# Patient Record
Sex: Female | Born: 1960
Health system: Southern US, Community
[De-identification: ages and names within clinical notes are randomized; demographics above are authoritative.]

## PROBLEM LIST (undated history)

## (undated) HISTORY — PX: POLYPECTOMY: SHX149

## (undated) HISTORY — PX: COLONOSCOPY: SHX174

---

## 2010-08-04 LAB — CONVERTED CEMR LAB: Pap Smear: NORMAL

## 2010-09-28 ENCOUNTER — Ambulatory Visit
Admission: RE | Admit: 2010-09-28 | Discharge: 2010-09-28 | Payer: Self-pay | Source: Home / Self Care | Attending: Family Medicine | Admitting: Family Medicine

## 2010-09-28 DIAGNOSIS — D239 Other benign neoplasm of skin, unspecified: Secondary | ICD-10-CM | POA: Insufficient documentation

## 2010-10-06 NOTE — Assessment & Plan Note (Signed)
Summary: NEW TO EST//CCM   Vital Signs:  Patient profile:   50 year old female Menstrual status:  regular LMP:     09/19/2010 Height:      63.25 inches Weight:      136 pounds BMI:     23.99 Temp:     98.1 degrees F oral Pulse rate:   72 / minute Pulse rhythm:   regular Resp:     12 per minute BP sitting:   102 / 72  (left arm) Cuff size:   regular  Vitals Entered By: Sid Falcon LPN (September 28, 2010 10:28 AM)  History of Present Illness: New patient to establish care. No major medical problems. History of preeclampsia during her only pregnancy. C-Section 2005.  History of dysplastic nevi.  Is already scheduled to see dermatologist here. Multiple nevi removed in past. No medications. No known allergies.  Both grandmothers with history of colon cancer. Aunt with type 1 diabetes.  Patient is married. Homemaker. Nonsmoker. Occasional alcohol use.  Patient is scheduled to see gynecologist yearly with recent normal Pap smear last December. Tetanus 2009 or 2010.  Preventive Screening-Counseling & Management  Alcohol-Tobacco     Smoking Status: never  Caffeine-Diet-Exercise     Does Patient Exercise: yes  Allergies (verified): No Known Drug Allergies  Past History:  Family History: Last updated: 09/28/2010 Maternal grandmother, colon ca Paternal grandmother, colon ca Mothers sister, diabetes type 1 Paternal grandfather, heart attack age 61  Social History: Last updated: 09/28/2010 Occupation: Married Never Smoked Alcohol use-yes Regular exercise-yes 1 pregnancy 1 live birth  Risk Factors: Exercise: yes (09/28/2010)  Risk Factors: Smoking Status: never (09/28/2010)  Past Medical History: UTI HBP following C-Section  Past Surgical History: Caesarean section PMH-FH-SH reviewed for relevance  Family History: Maternal grandmother, colon ca Paternal grandmother, colon ca Mothers sister, diabetes type 1 Paternal grandfather, heart attack age  49  Social History: Occupation: Married Never Smoked Alcohol use-yes Regular exercise-yes 1 pregnancy 1 live birth Occupation:  employed Smoking Status:  never Does Patient Exercise:  yes  Review of Systems  The patient denies anorexia, fever, weight loss, weight gain, vision loss, decreased hearing, hoarseness, chest pain, syncope, dyspnea on exertion, peripheral edema, prolonged cough, headaches, hemoptysis, abdominal pain, melena, hematochezia, severe indigestion/heartburn, hematuria, incontinence, genital sores, muscle weakness, suspicious skin lesions, transient blindness, difficulty walking, depression, unusual weight change, abnormal bleeding, enlarged lymph nodes, and breast masses.    Physical Exam  General:  Well-developed,well-nourished,in no acute distress; alert,appropriate and cooperative throughout examination Ears:  External ear exam shows no significant lesions or deformities.  Otoscopic examination reveals clear canals, tympanic membranes are intact bilaterally without bulging, retraction, inflammation or discharge. Hearing is grossly normal bilaterally. Mouth:  Oral mucosa and oropharynx without lesions or exudates.  Teeth in good repair. Neck:  No deformities, masses, or tenderness noted. Lungs:  Normal respiratory effort, chest expands symmetrically. Lungs are clear to auscultation, no crackles or wheezes. Heart:  Normal rate and regular rhythm. S1 and S2 normal without gallop, murmur, click, rub or other extra sounds. Extremities:  No clubbing, cyanosis, edema, or deformity noted with normal full range of motion of all joints.   Neurologic:  alert & oriented X3 and cranial nerves II-XII intact.     Impression & Recommendations:  Problem # 1:  DYSPLASTIC NEVUS (ICD-216.9) history of.  She will cont f/u with derm yearly.  Patient Instructions: 1)  Consider CPE at some point later this year.   Orders Added: 1)  New  Patient Level II [99202]    Preventive  Care Screening  Mammogram:    Date:  08/04/2010    Results:  normal   Pap Smear:    Date:  08/04/2010    Results:  normal   Last Tetanus Booster:    Date:  03/04/2008    Results:  Historical

## 2011-10-19 LAB — HM MAMMOGRAPHY: HM Mammogram: NEGATIVE

## 2011-10-19 LAB — HM PAP SMEAR: HM Pap smear: NEGATIVE

## 2011-11-01 ENCOUNTER — Other Ambulatory Visit: Payer: Self-pay | Admitting: Obstetrics and Gynecology

## 2011-11-01 DIAGNOSIS — R928 Other abnormal and inconclusive findings on diagnostic imaging of breast: Secondary | ICD-10-CM

## 2011-11-10 ENCOUNTER — Other Ambulatory Visit: Payer: Self-pay

## 2011-11-17 ENCOUNTER — Ambulatory Visit
Admission: RE | Admit: 2011-11-17 | Discharge: 2011-11-17 | Disposition: A | Discharge status: Home / Self Care | Payer: 59 | Source: Ambulatory Visit | Attending: Obstetrics and Gynecology | Admitting: Obstetrics and Gynecology

## 2011-11-17 DIAGNOSIS — R928 Other abnormal and inconclusive findings on diagnostic imaging of breast: Secondary | ICD-10-CM

## 2012-08-06 ENCOUNTER — Other Ambulatory Visit: Payer: 59

## 2012-08-13 ENCOUNTER — Encounter: Payer: 59 | Admitting: Family Medicine

## 2012-09-10 ENCOUNTER — Other Ambulatory Visit (INDEPENDENT_AMBULATORY_CARE_PROVIDER_SITE_OTHER): Payer: 59

## 2012-09-10 DIAGNOSIS — Z0000 Encounter for general adult medical examination without abnormal findings: Secondary | ICD-10-CM

## 2012-09-10 LAB — POCT URINALYSIS DIPSTICK
Glucose, UA: NEGATIVE
Ketones, UA: NEGATIVE
Leukocytes, UA: NEGATIVE
Nitrite, UA: NEGATIVE
Spec Grav, UA: >=1.03
Urobilinogen, UA: 0.2
pH, UA: 5.5

## 2012-09-10 LAB — HEPATIC FUNCTION PANEL
ALT: 16 U/L (ref 0–35)
AST: 15 U/L (ref 0–37)
Albumin: 4.3 g/dL (ref 3.5–5.2)
Alkaline Phosphatase: 50 U/L (ref 39–117)
Bilirubin, Direct: 0.1 mg/dL (ref 0.0–0.3)
Total Bilirubin: 1 mg/dL (ref 0.3–1.2)
Total Protein: 7.1 g/dL (ref 6.0–8.3)

## 2012-09-10 LAB — CBC WITH DIFFERENTIAL/PLATELET
Basophils Absolute: 0 10*3/uL (ref 0.0–0.1)
Basophils Relative: 0.5 % (ref 0.0–3.0)
Eosinophils Absolute: 0.1 10*3/uL (ref 0.0–0.7)
Eosinophils Relative: 2.3 % (ref 0.0–5.0)
HCT: 39.4 % (ref 36.0–46.0)
Hemoglobin: 13.2 g/dL (ref 12.0–15.0)
Lymphocytes Relative: 19.6 % (ref 12.0–46.0)
Lymphs Abs: 1 10*3/uL (ref 0.7–4.0)
MCHC: 33.5 g/dL (ref 30.0–36.0)
MCV: 91.1 fl (ref 78.0–100.0)
Monocytes Absolute: 0.6 10*3/uL (ref 0.1–1.0)
Monocytes Relative: 11.6 % (ref 3.0–12.0)
Neutro Abs: 3.5 10*3/uL (ref 1.4–7.7)
Neutrophils Relative %: 66 % (ref 43.0–77.0)
Platelets: 217 10*3/uL (ref 150.0–400.0)
RBC: 4.33 Mil/uL (ref 3.87–5.11)
RDW: 12.1 % (ref 11.5–14.6)
WBC: 5.3 10*3/uL (ref 4.5–10.5)

## 2012-09-10 LAB — BASIC METABOLIC PANEL
BUN: 12 mg/dL (ref 6–23)
CO2: 30 mEq/L (ref 19–32)
Calcium: 9.3 mg/dL (ref 8.4–10.5)
Chloride: 104 mEq/L (ref 96–112)
Creatinine, Ser: 0.7 mg/dL (ref 0.4–1.2)
GFR: 98.96 mL/min (ref >60.00)
Glucose, Bld: 85 mg/dL (ref 70–99)
Potassium: 3.5 mEq/L (ref 3.5–5.1)
Sodium: 138 mEq/L (ref 135–145)

## 2012-09-10 LAB — LIPID PANEL
Cholesterol: 172 mg/dL (ref 0–200)
HDL: 52.6 mg/dL (ref >39.00)
LDL Cholesterol: 100 mg/dL — ABNORMAL HIGH (ref 0–99)
Total CHOL/HDL Ratio: 3
Triglycerides: 97 mg/dL (ref 0.0–149.0)
VLDL: 19.4 mg/dL (ref 0.0–40.0)

## 2012-09-10 LAB — TSH: TSH: 1.07 u[IU]/mL (ref 0.35–5.50)

## 2012-09-17 ENCOUNTER — Encounter: Payer: Self-pay | Admitting: Family Medicine

## 2012-09-17 ENCOUNTER — Ambulatory Visit (INDEPENDENT_AMBULATORY_CARE_PROVIDER_SITE_OTHER): Payer: 59 | Admitting: Family Medicine

## 2012-09-17 VITALS — BP 110/78 | HR 72 | Temp 98.3°F | Resp 12 | Ht 64.0 in | Wt 135.0 lb

## 2012-09-17 DIAGNOSIS — Z0000 Encounter for general adult medical examination without abnormal findings: Secondary | ICD-10-CM

## 2012-09-17 LAB — URINALYSIS, ROUTINE W REFLEX MICROSCOPIC
Bilirubin Urine: NEGATIVE
Ketones, ur: NEGATIVE
Leukocytes, UA: NEGATIVE
Nitrite: NEGATIVE
Specific Gravity, Urine: >=1.03 (ref 1.000–1.030)
Total Protein, Urine: NEGATIVE
Urine Glucose: NEGATIVE
Urobilinogen, UA: 0.2 (ref 0.0–1.0)
pH: 6 (ref 5.0–8.0)

## 2012-09-17 LAB — POCT URINALYSIS DIPSTICK
Bilirubin, UA: NEGATIVE
Glucose, UA: NEGATIVE
Ketones, UA: NEGATIVE
Leukocytes, UA: NEGATIVE
Nitrite, UA: NEGATIVE
Protein, UA: NEGATIVE
Spec Grav, UA: 1.025
Urobilinogen, UA: 0.2
pH, UA: 6

## 2012-09-17 NOTE — Progress Notes (Signed)
  Subjective:    Patient ID: Cheyenne Rojas, female    DOB: Nov 29, 1960, 52 y.o.   MRN: [id number]  HPI  Patient seen for complete physical. She sees gynecologist regularly. No significant chronic medical problems. Flu vaccine in the fall. Tetanus up-to-date. Turned 51 this year. No history of prior colonoscopy. She exercises with walking. Nonsmoker.  Family history reviewed in unrevealing other than she had maternal grandmother with colon cancer.  No past medical history on file. No past surgical history on file.  reports that she has never smoked. She does not have any smokeless tobacco history on file. Her alcohol and drug histories not on file. family history is not on file. No Known Allergies   Review of Systems  Constitutional: Negative for fever, activity change, appetite change, fatigue and unexpected weight change.  HENT: Negative for hearing loss, ear pain, sore throat and trouble swallowing.   Eyes: Negative for visual disturbance.  Respiratory: Negative for cough and shortness of breath.   Cardiovascular: Negative for chest pain and palpitations.  Gastrointestinal: Negative for abdominal pain, diarrhea, constipation and blood in stool.  Genitourinary: Negative for dysuria and hematuria.  Musculoskeletal: Negative for myalgias, back pain and arthralgias.  Skin: Negative for rash.  Neurological: Negative for dizziness, syncope and headaches.  Hematological: Negative for adenopathy.  Psychiatric/Behavioral: Negative for confusion and dysphoric mood.       Objective:   Physical Exam  Constitutional: She is oriented to person, place, and time. She appears well-developed and well-nourished.  HENT:  Head: Normocephalic and atraumatic.  Eyes: EOM are normal. Pupils are equal, round, and reactive to light.  Neck: Normal range of motion. Neck supple. No thyromegaly present.  Cardiovascular: Normal rate, regular rhythm and normal heart sounds.   No murmur  heard. Pulmonary/Chest: Breath sounds normal. No respiratory distress. She has no wheezes. She has no rales.  Abdominal: Soft. Bowel sounds are normal. She exhibits no distension and no mass. There is no tenderness. There is no rebound and no guarding.  Genitourinary:       Per GYN  Musculoskeletal: Normal range of motion. She exhibits no edema.  Lymphadenopathy:    She has no cervical adenopathy.  Neurological: She is alert and oriented to person, place, and time. No cranial nerve deficit.  Skin: No rash noted.  Psychiatric: She has a normal mood and affect. Her behavior is normal. Judgment and thought content normal.          Assessment & Plan:  Healthy 52 year old female. She has blood on urine dipstick again with repeat today. Send for urine micro-. Set up screening colonoscopy

## 2012-09-17 NOTE — Patient Instructions (Addendum)
We will call you regarding colonoscopy screening We are sending urine micro and if positive for blood we will be back in touch

## 2012-09-19 ENCOUNTER — Other Ambulatory Visit: Payer: Self-pay | Admitting: *Deleted

## 2012-09-19 DIAGNOSIS — R319 Hematuria, unspecified: Secondary | ICD-10-CM

## 2012-09-19 NOTE — Progress Notes (Signed)
Quick Note:  Pt informed, future UA ordered ______ 

## 2012-09-24 ENCOUNTER — Encounter: Payer: Self-pay | Admitting: Internal Medicine

## 2012-10-01 ENCOUNTER — Other Ambulatory Visit (INDEPENDENT_AMBULATORY_CARE_PROVIDER_SITE_OTHER): Payer: 59

## 2012-10-01 ENCOUNTER — Other Ambulatory Visit: Payer: 59 | Admitting: Family Medicine

## 2012-10-01 DIAGNOSIS — R319 Hematuria, unspecified: Secondary | ICD-10-CM

## 2012-10-01 LAB — POCT URINALYSIS DIPSTICK
Bilirubin, UA: NEGATIVE
Glucose, UA: NEGATIVE
Ketones, UA: NEGATIVE
Leukocytes, UA: NEGATIVE
Nitrite, UA: NEGATIVE
Protein, UA: NEGATIVE
Spec Grav, UA: 1.025
Urobilinogen, UA: 0.2
pH, UA: 5.5

## 2012-10-02 ENCOUNTER — Other Ambulatory Visit: Payer: Self-pay | Admitting: Family Medicine

## 2012-10-02 DIAGNOSIS — R319 Hematuria, unspecified: Secondary | ICD-10-CM

## 2012-10-08 ENCOUNTER — Other Ambulatory Visit: Payer: 59

## 2012-10-08 DIAGNOSIS — R319 Hematuria, unspecified: Secondary | ICD-10-CM

## 2012-10-09 LAB — URINALYSIS, MICROSCOPIC ONLY
Bacteria, UA: NONE SEEN
Casts: NONE SEEN
Crystals: NONE SEEN
Squamous Epithelial / LPF: NONE SEEN

## 2012-10-16 NOTE — Progress Notes (Signed)
Quick Note:  Pt informed ______ 

## 2012-10-20 ENCOUNTER — Other Ambulatory Visit: Payer: Self-pay

## 2012-10-25 ENCOUNTER — Ambulatory Visit (AMBULATORY_SURGERY_CENTER): Payer: 59

## 2012-10-25 ENCOUNTER — Encounter: Payer: Self-pay | Admitting: Internal Medicine

## 2012-10-25 VITALS — Ht 64.0 in | Wt 140.0 lb

## 2012-10-25 DIAGNOSIS — Z1211 Encounter for screening for malignant neoplasm of colon: Secondary | ICD-10-CM

## 2012-10-25 MED ORDER — MOVIPREP 100 G PO SOLR: 1.0000 | Freq: Once | ORAL | Status: DC

## 2012-11-13 ENCOUNTER — Encounter: Payer: 59 | Admitting: Internal Medicine

## 2012-11-29 ENCOUNTER — Encounter: Payer: Self-pay | Admitting: Internal Medicine

## 2012-11-29 ENCOUNTER — Ambulatory Visit (AMBULATORY_SURGERY_CENTER): Payer: 59 | Admitting: Internal Medicine

## 2012-11-29 VITALS — BP 101/75 | HR 81 | Temp 98.1°F | Resp 18 | Ht 64.0 in | Wt 140.0 lb

## 2012-11-29 DIAGNOSIS — D126 Benign neoplasm of colon, unspecified: Secondary | ICD-10-CM

## 2012-11-29 DIAGNOSIS — Z1211 Encounter for screening for malignant neoplasm of colon: Secondary | ICD-10-CM

## 2012-11-29 MED ORDER — SODIUM CHLORIDE 0.9 % IV SOLN: 500.0000 mL | INTRAVENOUS | Status: DC

## 2012-11-29 NOTE — Progress Notes (Signed)
Report to pacu rn, vss, bbs=clear 

## 2012-11-29 NOTE — Progress Notes (Signed)
Patient did not experience any of the following events: a burn prior to discharge; a fall within the facility; wrong site/side/patient/procedure/implant event; or a hospital transfer or hospital admission upon discharge from the facility. (G8907) Patient did not have preoperative order for IV antibiotic SSI prophylaxis. (G8918)  

## 2012-11-29 NOTE — Op Note (Signed)
[facility] [address] [place], [address]   COLONOSCOPY PROCEDURE REPORT  PATIENT: Cheyenne, Rojas  MR#: [id number] BIRTHDATE: 1960/11/24 , 51  yrs. old GENDER: Female ENDOSCOPIST: Hart Carwin, MD REFERRED BY:  Evelena Peat, M.D. PROCEDURE DATE:  11/29/2012 PROCEDURE:   Colonoscopy with snare polypectomy ASA CLASS:   Class I INDICATIONS:Average risk patient for colon cancer and 2 grandmothers with colon cancer. MEDICATIONS: MAC sedation, administered by CRNA and Propofol (Diprivan) 270 mg IV  DESCRIPTION OF PROCEDURE:   After the risks and benefits and of the procedure were explained, informed consent was obtained.  A digital rectal exam revealed no abnormalities of the rectum.    The LB PCF-H180AL [id number]  endoscope was introduced through the anus and advanced to the cecum, which was identified by both the appendix and ileocecal valve .  The quality of the prep was excellent, using MoviPrep .  The instrument was then slowly withdrawn as the colon was fully examined.     COLON FINDINGS: A smooth sessile polyp ranging between 5-66mm in size was found in the descending colon.  A polypectomy was performed with a cold snare.  The resection was complete and the polyp tissue was completely retrieved.   Moderate sized internal and external hemorrhoids were found.     Retroflexed views revealed no abnormalities.     The scope was then withdrawn from the patient and the procedure completed.  COMPLICATIONS: There were no complications. ENDOSCOPIC IMPRESSION: 1.   Sessile polyp ranging between 5-40mm in size was found in the descending colon; polypectomy was performed with a cold snare 2.   Moderate sized internal and external hemorrhoids  RECOMMENDATIONS: 1.  Await pathology results 2.  High fiber diet   REPEAT EXAM: for Colonoscopy, pending biopsy results.  cc:  _______________________________ eSignedHart Carwin, MD 11/29/2012 10:12  AM     PATIENT NAME:  Cheyenne, Rojas MR#: [id number]

## 2012-11-29 NOTE — Progress Notes (Signed)
Called to room to assist during endoscopic procedure.  Patient ID and intended procedure confirmed with present staff. Received instructions for my participation in the procedure from the performing physician.  

## 2012-11-29 NOTE — Patient Instructions (Addendum)

## 2012-12-02 ENCOUNTER — Telehealth: Payer: Self-pay

## 2012-12-02 NOTE — Telephone Encounter (Signed)
Left a message on the pt's answering machine at [phone] to call if any questions or concerns. Maw

## 2012-12-03 ENCOUNTER — Encounter: Payer: Self-pay | Admitting: Internal Medicine

## 2013-01-22 ENCOUNTER — Other Ambulatory Visit: Payer: Self-pay | Admitting: Obstetrics and Gynecology

## 2013-03-10 ENCOUNTER — Ambulatory Visit: Payer: 59 | Admitting: Family Medicine

## 2013-03-13 ENCOUNTER — Ambulatory Visit (INDEPENDENT_AMBULATORY_CARE_PROVIDER_SITE_OTHER): Payer: 59 | Admitting: Family Medicine

## 2013-03-13 ENCOUNTER — Telehealth: Payer: Self-pay | Admitting: Family Medicine

## 2013-03-13 DIAGNOSIS — Z23 Encounter for immunization: Secondary | ICD-10-CM

## 2013-03-13 NOTE — Telephone Encounter (Signed)
PT requested to come at 10am for her upcoming injection appt on 03/20/13. Is this okay with you?

## 2013-03-13 NOTE — Telephone Encounter (Signed)
Yes that is fine, but i would prefer 8 or 815. If that is okay with the patient

## 2013-03-20 ENCOUNTER — Ambulatory Visit (INDEPENDENT_AMBULATORY_CARE_PROVIDER_SITE_OTHER): Payer: 59 | Admitting: Family Medicine

## 2013-03-20 DIAGNOSIS — Z23 Encounter for immunization: Secondary | ICD-10-CM

## 2013-04-04 ENCOUNTER — Ambulatory Visit (INDEPENDENT_AMBULATORY_CARE_PROVIDER_SITE_OTHER): Payer: 59 | Admitting: *Deleted

## 2013-04-04 DIAGNOSIS — Z23 Encounter for immunization: Secondary | ICD-10-CM

## 2013-05-20 ENCOUNTER — Ambulatory Visit (INDEPENDENT_AMBULATORY_CARE_PROVIDER_SITE_OTHER): Payer: 59

## 2013-05-20 DIAGNOSIS — Z23 Encounter for immunization: Secondary | ICD-10-CM

## 2013-11-11 ENCOUNTER — Other Ambulatory Visit (INDEPENDENT_AMBULATORY_CARE_PROVIDER_SITE_OTHER): Payer: BC Managed Care – PPO

## 2013-11-11 DIAGNOSIS — Z0000 Encounter for general adult medical examination without abnormal findings: Secondary | ICD-10-CM

## 2013-11-11 LAB — LIPID PANEL
Cholesterol: 175 mg/dL (ref 0–200)
HDL: 61 mg/dL (ref >39.00)
LDL Cholesterol: 104 mg/dL — ABNORMAL HIGH (ref 0–99)
Total CHOL/HDL Ratio: 3
Triglycerides: 48 mg/dL (ref 0.0–149.0)
VLDL: 9.6 mg/dL (ref 0.0–40.0)

## 2013-11-11 LAB — HEPATIC FUNCTION PANEL
ALT: 26 U/L (ref 0–35)
AST: 25 U/L (ref 0–37)
Albumin: 4.5 g/dL (ref 3.5–5.2)
Alkaline Phosphatase: 68 U/L (ref 39–117)
Bilirubin, Direct: 0.1 mg/dL (ref 0.0–0.3)
Total Bilirubin: 0.9 mg/dL (ref 0.3–1.2)
Total Protein: 7.2 g/dL (ref 6.0–8.3)

## 2013-11-11 LAB — POCT URINALYSIS DIPSTICK
Bilirubin, UA: NEGATIVE
Glucose, UA: NEGATIVE
Ketones, UA: NEGATIVE
Leukocytes, UA: NEGATIVE
Nitrite, UA: NEGATIVE
Spec Grav, UA: >=1.03
Urobilinogen, UA: 0.2
pH, UA: 5

## 2013-11-11 LAB — CBC WITH DIFFERENTIAL/PLATELET
Basophils Absolute: 0 10*3/uL (ref 0.0–0.1)
Basophils Relative: 0.3 % (ref 0.0–3.0)
Eosinophils Absolute: 0.1 10*3/uL (ref 0.0–0.7)
Eosinophils Relative: 1.8 % (ref 0.0–5.0)
HCT: 41.2 % (ref 36.0–46.0)
Hemoglobin: 13.9 g/dL (ref 12.0–15.0)
Lymphocytes Relative: 14.5 % (ref 12.0–46.0)
Lymphs Abs: 1 10*3/uL (ref 0.7–4.0)
MCHC: 33.7 g/dL (ref 30.0–36.0)
MCV: 91.4 fl (ref 78.0–100.0)
Monocytes Absolute: 0.7 10*3/uL (ref 0.1–1.0)
Monocytes Relative: 9.9 % (ref 3.0–12.0)
Neutro Abs: 4.9 10*3/uL (ref 1.4–7.7)
Neutrophils Relative %: 73.5 % (ref 43.0–77.0)
Platelets: 219 10*3/uL (ref 150.0–400.0)
RBC: 4.51 Mil/uL (ref 3.87–5.11)
RDW: 13.7 % (ref 11.5–14.6)
WBC: 6.7 10*3/uL (ref 4.5–10.5)

## 2013-11-11 LAB — BASIC METABOLIC PANEL
BUN: 14 mg/dL (ref 6–23)
CO2: 23 mEq/L (ref 19–32)
Calcium: 9 mg/dL (ref 8.4–10.5)
Chloride: 105 mEq/L (ref 96–112)
Creatinine, Ser: 0.6 mg/dL (ref 0.4–1.2)
GFR: 105.75 mL/min (ref >60.00)
Glucose, Bld: 70 mg/dL (ref 70–99)
Potassium: 3.7 mEq/L (ref 3.5–5.1)
Sodium: 136 mEq/L (ref 135–145)

## 2013-11-11 LAB — TSH: TSH: 0.38 u[IU]/mL (ref 0.35–5.50)

## 2013-11-12 ENCOUNTER — Encounter: Payer: 59 | Admitting: Family Medicine

## 2013-11-14 ENCOUNTER — Encounter: Payer: Self-pay | Admitting: Family Medicine

## 2013-11-14 ENCOUNTER — Ambulatory Visit (INDEPENDENT_AMBULATORY_CARE_PROVIDER_SITE_OTHER): Payer: BC Managed Care – PPO | Admitting: Family Medicine

## 2013-11-14 VITALS — BP 112/76 | HR 88 | Ht 64.0 in | Wt 146.0 lb

## 2013-11-14 DIAGNOSIS — Z0000 Encounter for general adult medical examination without abnormal findings: Secondary | ICD-10-CM

## 2013-11-14 DIAGNOSIS — R319 Hematuria, unspecified: Secondary | ICD-10-CM

## 2013-11-14 LAB — POCT URINALYSIS DIPSTICK
Bilirubin, UA: NEGATIVE
Glucose, UA: NEGATIVE
Ketones, UA: NEGATIVE
Leukocytes, UA: NEGATIVE
Nitrite, UA: NEGATIVE
Protein, UA: NEGATIVE
Spec Grav, UA: 1.025
Urobilinogen, UA: 0.2
pH, UA: 6

## 2013-11-14 NOTE — Progress Notes (Signed)
Pre visit review using our clinic review tool, if applicable. No additional management support is needed unless otherwise documented below in the visit note. 

## 2013-11-14 NOTE — Addendum Note (Signed)
Addended by: Marcina Millard on: 11/14/2013 10:46 AM   Modules accepted: Orders

## 2013-11-14 NOTE — Progress Notes (Signed)
   Subjective:    Patient ID: Cheyenne Rojas, female    DOB: March 17, 1961, 53 y.o.   MRN: [id number]  HPI Patient seen for complete physical Generally very healthy. She sees gynecologist regularly. Takes no medications. She's been exercising regularly. Nonsmoker. Family history of longevity on both sides. Patient colonoscopy last year. She is getting regular Pap smears. Tetanus up-to-date. She also sees dermatologist regularly  No past medical history on file. Past Surgical History  Procedure Laterality Date  . Cesarean section      reports that she has never smoked. She has never used smokeless tobacco. She reports that she drinks alcohol. She reports that she does not use illicit drugs. family history includes Colon cancer in her maternal grandmother and paternal grandmother. There is no history of Esophageal cancer, Rectal cancer, or Stomach cancer. No Known Allergies    Review of Systems  Constitutional: Negative for fever, activity change, appetite change, fatigue and unexpected weight change.  HENT: Negative for ear pain, hearing loss, sore throat and trouble swallowing.   Eyes: Negative for visual disturbance.  Respiratory: Negative for cough and shortness of breath.   Cardiovascular: Negative for chest pain and palpitations.  Gastrointestinal: Negative for abdominal pain, diarrhea, constipation and blood in stool.  Endocrine: Negative for polydipsia and polyuria.  Genitourinary: Negative for dysuria and hematuria.  Musculoskeletal: Negative for arthralgias, back pain and myalgias.  Skin: Negative for rash.  Neurological: Negative for dizziness, syncope and headaches.  Hematological: Negative for adenopathy.  Psychiatric/Behavioral: Negative for confusion and dysphoric mood.       Objective:   Physical Exam  Constitutional: She is oriented to person, place, and time. She appears well-developed and well-nourished.  HENT:  Head: Normocephalic and atraumatic.  Eyes: EOM are  normal. Pupils are equal, round, and reactive to light.  Neck: Normal range of motion. Neck supple. No thyromegaly present.  Cardiovascular: Normal rate, regular rhythm and normal heart sounds.   No murmur heard. Pulmonary/Chest: Breath sounds normal. No respiratory distress. She has no wheezes. She has no rales.  Abdominal: Soft. Bowel sounds are normal. She exhibits no distension and no mass. There is no tenderness. There is no rebound and no guarding.  Genitourinary:  Per GYN  Musculoskeletal: Normal range of motion. She exhibits no edema.  Lymphadenopathy:    She has no cervical adenopathy.  Neurological: She is alert and oriented to person, place, and time. She displays normal reflexes. No cranial nerve deficit.  Skin: No rash noted.  Psychiatric: She has a normal mood and affect. Her behavior is normal. Judgment and thought content normal.          Assessment & Plan:  Complete physical. Labs reviewed. She has trace blood on urine dipstick but this was noted last year and urine micro-negative. Continue healthy lifestyle habits. Followup in one year and sooner as needed

## 2014-01-29 ENCOUNTER — Other Ambulatory Visit: Payer: Self-pay | Admitting: Obstetrics and Gynecology

## 2014-03-13 ENCOUNTER — Ambulatory Visit (INDEPENDENT_AMBULATORY_CARE_PROVIDER_SITE_OTHER): Payer: BC Managed Care – PPO | Admitting: Family Medicine

## 2014-03-13 DIAGNOSIS — Z23 Encounter for immunization: Secondary | ICD-10-CM

## 2014-05-29 ENCOUNTER — Ambulatory Visit (INDEPENDENT_AMBULATORY_CARE_PROVIDER_SITE_OTHER): Payer: BC Managed Care – PPO | Admitting: Family Medicine

## 2014-05-29 DIAGNOSIS — Z23 Encounter for immunization: Secondary | ICD-10-CM

## 2014-11-18 ENCOUNTER — Other Ambulatory Visit (INDEPENDENT_AMBULATORY_CARE_PROVIDER_SITE_OTHER): Payer: BLUE CROSS/BLUE SHIELD

## 2014-11-18 DIAGNOSIS — Z0000 Encounter for general adult medical examination without abnormal findings: Secondary | ICD-10-CM

## 2014-11-18 LAB — CBC WITH DIFFERENTIAL/PLATELET
Basophils Absolute: 0 10*3/uL (ref 0.0–0.1)
Basophils Relative: 0.6 % (ref 0.0–3.0)
Eosinophils Absolute: 0.1 10*3/uL (ref 0.0–0.7)
Eosinophils Relative: 2.4 % (ref 0.0–5.0)
HCT: 40.6 % (ref 36.0–46.0)
Hemoglobin: 14.1 g/dL (ref 12.0–15.0)
Lymphocytes Relative: 20.7 % (ref 12.0–46.0)
Lymphs Abs: 1 10*3/uL (ref 0.7–4.0)
MCHC: 34.6 g/dL (ref 30.0–36.0)
MCV: 88.9 fl (ref 78.0–100.0)
Monocytes Absolute: 0.5 10*3/uL (ref 0.1–1.0)
Monocytes Relative: 9.5 % (ref 3.0–12.0)
Neutro Abs: 3.2 10*3/uL (ref 1.4–7.7)
Neutrophils Relative %: 66.8 % (ref 43.0–77.0)
Platelets: 199 10*3/uL (ref 150.0–400.0)
RBC: 4.57 Mil/uL (ref 3.87–5.11)
RDW: 12.4 % (ref 11.5–15.5)
WBC: 4.8 10*3/uL (ref 4.0–10.5)

## 2014-11-18 LAB — LIPID PANEL
Cholesterol: 199 mg/dL (ref 0–200)
HDL: 58.3 mg/dL (ref >39.00)
LDL Cholesterol: 124 mg/dL — ABNORMAL HIGH (ref 0–99)
NonHDL: 140.7
Total CHOL/HDL Ratio: 3
Triglycerides: 85 mg/dL (ref 0.0–149.0)
VLDL: 17 mg/dL (ref 0.0–40.0)

## 2014-11-18 LAB — COMPREHENSIVE METABOLIC PANEL
ALT: 11 U/L (ref 0–35)
AST: 15 U/L (ref 0–37)
Albumin: 4.6 g/dL (ref 3.5–5.2)
Alkaline Phosphatase: 59 U/L (ref 39–117)
BUN: 16 mg/dL (ref 6–23)
CO2: 29 mEq/L (ref 19–32)
Calcium: 9.3 mg/dL (ref 8.4–10.5)
Chloride: 102 mEq/L (ref 96–112)
Creatinine, Ser: 0.75 mg/dL (ref 0.40–1.20)
GFR: 86.13 mL/min (ref >60.00)
Glucose, Bld: 84 mg/dL (ref 70–99)
Potassium: 3.8 mEq/L (ref 3.5–5.1)
Sodium: 137 mEq/L (ref 135–145)
Total Bilirubin: 0.6 mg/dL (ref 0.2–1.2)
Total Protein: 7 g/dL (ref 6.0–8.3)

## 2014-11-18 LAB — TSH: TSH: 0.63 u[IU]/mL (ref 0.35–4.50)

## 2014-11-25 ENCOUNTER — Encounter: Payer: Self-pay | Admitting: Family Medicine

## 2014-11-25 ENCOUNTER — Ambulatory Visit (INDEPENDENT_AMBULATORY_CARE_PROVIDER_SITE_OTHER): Payer: BLUE CROSS/BLUE SHIELD | Admitting: Family Medicine

## 2014-11-25 VITALS — BP 118/78 | HR 86 | Temp 97.6°F | Ht 63.0 in | Wt 141.0 lb

## 2014-11-25 DIAGNOSIS — Z0000 Encounter for general adult medical examination without abnormal findings: Secondary | ICD-10-CM | POA: Diagnosis not present

## 2014-11-25 NOTE — Progress Notes (Signed)
   Subjective:    Patient ID: Cheyenne Rojas, female    DOB: 1961/02/18, 54 y.o.   MRN: [id number]  HPI Patient seen for complete physical. She sees gynecologist regularly. She gets regular yearly Pap smears and mammograms through them. She exercises with walking and tennis. Nonsmoker. No chronic medical problems. She also sees dermatologist yearly. No recent complaints. Tetanus up-to-date. Colonoscopy up-to-date.  No past medical history on file. Past Surgical History  Procedure Laterality Date  . Cesarean section      reports that she has never smoked. She has never used smokeless tobacco. She reports that she drinks alcohol. She reports that she does not use illicit drugs. family history includes Colon cancer in her maternal grandmother and paternal grandmother. There is no history of Esophageal cancer, Rectal cancer, or Stomach cancer. No Known Allergies    Review of Systems  Constitutional: Negative for fever, activity change, appetite change, fatigue and unexpected weight change.  HENT: Negative for ear pain, hearing loss, sore throat and trouble swallowing.   Eyes: Negative for visual disturbance.  Respiratory: Negative for cough and shortness of breath.   Cardiovascular: Negative for chest pain and palpitations.  Gastrointestinal: Negative for abdominal pain, diarrhea, constipation and blood in stool.  Genitourinary: Negative for dysuria and hematuria.  Musculoskeletal: Negative for myalgias, back pain and arthralgias.  Skin: Negative for rash.  Neurological: Negative for dizziness, syncope and headaches.  Hematological: Negative for adenopathy.  Psychiatric/Behavioral: Negative for confusion and dysphoric mood.       Objective:   Physical Exam  Constitutional: She is oriented to person, place, and time. She appears well-developed and well-nourished.  HENT:  Head: Normocephalic and atraumatic.  Eyes: EOM are normal. Pupils are equal, round, and reactive to light.  Neck:  Normal range of motion. Neck supple. No thyromegaly present.  Cardiovascular: Normal rate, regular rhythm and normal heart sounds.   No murmur heard. Pulmonary/Chest: Breath sounds normal. No respiratory distress. She has no wheezes. She has no rales.  Abdominal: Soft. Bowel sounds are normal. She exhibits no distension and no mass. There is no tenderness. There is no rebound and no guarding.  Genitourinary:  Per GYN  Musculoskeletal: Normal range of motion. She exhibits no edema.  Lymphadenopathy:    She has no cervical adenopathy.  Neurological: She is alert and oriented to person, place, and time. No cranial nerve deficit.  Skin: No rash noted.  Psychiatric: She has a normal mood and affect. Her behavior is normal. Judgment and thought content normal.          Assessment & Plan:  Complete physical. Labs reviewed with no major concerns. Continue GYN follow-up. Immunizations up-to-date.

## 2014-11-25 NOTE — Progress Notes (Signed)
Pre visit review using our clinic review tool, if applicable. No additional management support is needed unless otherwise documented below in the visit note. 

## 2015-01-26 ENCOUNTER — Encounter: Payer: Self-pay | Admitting: *Deleted

## 2015-02-19 ENCOUNTER — Other Ambulatory Visit: Payer: Self-pay | Admitting: Obstetrics and Gynecology

## 2015-02-22 LAB — CYTOLOGY - PAP

## 2016-11-01 ENCOUNTER — Encounter: Payer: Self-pay | Admitting: Family Medicine

## 2016-11-22 ENCOUNTER — Encounter: Payer: Self-pay | Admitting: Family Medicine

## 2017-05-24 ENCOUNTER — Encounter: Payer: Self-pay | Admitting: Family Medicine

## 2017-06-08 ENCOUNTER — Ambulatory Visit (INDEPENDENT_AMBULATORY_CARE_PROVIDER_SITE_OTHER): Payer: BLUE CROSS/BLUE SHIELD

## 2017-06-08 DIAGNOSIS — Z23 Encounter for immunization: Secondary | ICD-10-CM | POA: Diagnosis not present

## 2017-08-18 DIAGNOSIS — D1801 Hemangioma of skin and subcutaneous tissue: Secondary | ICD-10-CM | POA: Diagnosis not present

## 2017-08-18 DIAGNOSIS — L821 Other seborrheic keratosis: Secondary | ICD-10-CM | POA: Diagnosis not present

## 2017-08-18 DIAGNOSIS — D2261 Melanocytic nevi of right upper limb, including shoulder: Secondary | ICD-10-CM | POA: Diagnosis not present

## 2017-08-18 DIAGNOSIS — D2262 Melanocytic nevi of left upper limb, including shoulder: Secondary | ICD-10-CM | POA: Diagnosis not present

## 2017-09-20 ENCOUNTER — Encounter: Payer: Self-pay | Admitting: Gastroenterology

## 2017-11-28 ENCOUNTER — Other Ambulatory Visit: Payer: Self-pay

## 2017-11-28 ENCOUNTER — Ambulatory Visit (AMBULATORY_SURGERY_CENTER): Payer: Self-pay | Admitting: *Deleted

## 2017-11-28 VITALS — Ht 64.0 in | Wt 153.6 lb

## 2017-11-28 DIAGNOSIS — Z86010 Personal history of colonic polyps: Secondary | ICD-10-CM

## 2017-11-28 NOTE — Progress Notes (Signed)
No egg or soy allergy known to patient  No issues with past sedation with any surgeries  or procedures, no intubation problems  No diet pills per patient No home 02 use per patient  No blood thinners per patient  Pt denies issues with constipation  No A fib or A flutter  EMMI video sent to pt's e mail pt declined   

## 2017-12-07 ENCOUNTER — Encounter: Payer: Self-pay | Admitting: Gastroenterology

## 2017-12-12 ENCOUNTER — Encounter: Payer: BLUE CROSS/BLUE SHIELD | Admitting: Gastroenterology

## 2017-12-18 ENCOUNTER — Other Ambulatory Visit: Payer: Self-pay

## 2017-12-18 ENCOUNTER — Encounter: Payer: Self-pay | Admitting: Gastroenterology

## 2017-12-18 ENCOUNTER — Ambulatory Visit (AMBULATORY_SURGERY_CENTER): Payer: BLUE CROSS/BLUE SHIELD | Admitting: Gastroenterology

## 2017-12-18 VITALS — BP 117/65 | HR 71 | Temp 98.7°F | Resp 16 | Ht 64.0 in | Wt 153.0 lb

## 2017-12-18 DIAGNOSIS — Z86010 Personal history of colonic polyps: Secondary | ICD-10-CM

## 2017-12-18 DIAGNOSIS — Z800 Family history of malignant neoplasm of digestive organs: Secondary | ICD-10-CM

## 2017-12-18 DIAGNOSIS — D122 Benign neoplasm of ascending colon: Secondary | ICD-10-CM

## 2017-12-18 DIAGNOSIS — K635 Polyp of colon: Secondary | ICD-10-CM | POA: Diagnosis not present

## 2017-12-18 MED ORDER — SODIUM CHLORIDE 0.9 % IV SOLN: 500.0000 mL | Freq: Once | INTRAVENOUS | Status: DC

## 2017-12-18 NOTE — Progress Notes (Signed)
Called to room to assist during endoscopic procedure.  Patient ID and intended procedure confirmed with present staff. Received instructions for my participation in the procedure from the performing physician.  

## 2017-12-18 NOTE — Op Note (Signed)
[facility] Patient Name: Cheyenne Rojas Procedure Date: 12/18/2017 9:18 AM MRN: [id number] Endoscopist: Mauri Pole , MD Age: 57 Referring MD:  Date of Birth: [birth year] Gender: Female Account #: [id number] Procedure:                Colonoscopy Indications:              Colon cancer screening in patient at increased                            risk: Family history of colorectal cancer in                            multiple 2nd degree relatives, High risk colon                            cancer surveillance: Personal history of colonic                            polyps, High risk colon cancer surveillance:                            Personal history of adenoma less than 10 mm in size Medicines:                Monitored Anesthesia Care Procedure:                Pre-Anesthesia Assessment:                           - Prior to the procedure, a History and Physical                            was performed, and patient medications and                            allergies were reviewed. The patient's tolerance of                            previous anesthesia was also reviewed. The risks                            and benefits of the procedure and the sedation                            options and risks were discussed with the patient.                            All questions were answered, and informed consent                            was obtained. Prior Anticoagulants: The patient has                            taken no previous anticoagulant or antiplatelet  agents. ASA Grade Assessment: I - A normal, healthy                            patient. After reviewing the risks and benefits,                            the patient was deemed in satisfactory condition to                            undergo the procedure.                           After obtaining informed consent, the colonoscope                            was passed under direct vision.  Throughout the                            procedure, the patient's blood pressure, pulse, and                            oxygen saturations were monitored continuously. The                            Colonoscope was introduced through the anus and                            advanced to the the cecum, identified by                            appendiceal orifice and ileocecal valve. The                            colonoscopy was performed without difficulty. The                            patient tolerated the procedure well. The quality                            of the bowel preparation was excellent. The                            terminal ileum, ileocecal valve, appendiceal                            orifice, and rectum were photographed. Scope In: 9:27:30 AM Scope Out: 9:49:23 AM Scope Withdrawal Time: 0 hours 13 minutes 45 seconds  Total Procedure Duration: 0 hours 21 minutes 53 seconds  Findings:                 The perianal and digital rectal examinations were                            normal.  A 11 mm polyp was found in the ascending colon. The                            polyp was sessile. The polyp was removed with a                            cold snare. Resection and retrieval were complete.                           Scattered small-mouthed diverticula were found in                            the sigmoid colon, descending colon, transverse                            colon, ascending colon and cecum. There was no                            evidence of diverticular bleeding.                           Non-bleeding internal hemorrhoids were found during                            retroflexion. The hemorrhoids were small.                           Anal papilla(e) were hypertrophied. Complications:            No immediate complications. Estimated Blood Loss:     Estimated blood loss was minimal. Impression:               - One 11 mm polyp in the ascending  colon, removed                            with a cold snare. Resected and retrieved.                           - Moderate diverticulosis in the sigmoid colon, in                            the descending colon, in the transverse colon, in                            the ascending colon and in the cecum. There was no                            evidence of diverticular bleeding.                           - Non-bleeding internal hemorrhoids.                           - Anal papilla(e) were hypertrophied. Recommendation:           -  Patient has a contact number available for                            emergencies. The signs and symptoms of potential                            delayed complications were discussed with the                            patient. Return to normal activities tomorrow.                            Written discharge instructions were provided to the                            patient.                           - Resume previous diet.                           - Continue present medications.                           - Await pathology results.                           - Repeat colonoscopy in 3 years for surveillance                            based on pathology results. Mauri Pole, MD 12/18/2017 9:52:38 AM This report has been signed electronically.

## 2017-12-18 NOTE — Patient Instructions (Signed)
YOU HAD AN ENDOSCOPIC PROCEDURE TODAY AT THE [facility]:   Refer to the procedure report that was given to you for any specific questions about what was found during the examination.  If the procedure report does not answer your questions, please call your gastroenterologist to clarify.  If you requested that your care partner not be given the details of your procedure findings, then the procedure report has been included in a sealed envelope for you to review at your convenience later.  YOU SHOULD EXPECT: Some feelings of bloating in the abdomen. Passage of more gas than usual.  Walking can help get rid of the air that was put into your GI tract during the procedure and reduce the bloating. If you had a lower endoscopy (such as a colonoscopy or flexible sigmoidoscopy) you may notice spotting of blood in your stool or on the toilet paper. If you underwent a bowel prep for your procedure, you may not have a normal bowel movement for a few days.  Please Note:  You might notice some irritation and congestion in your nose or some drainage.  This is from the oxygen used during your procedure.  There is no need for concern and it should clear up in a day or so.  SYMPTOMS TO REPORT IMMEDIATELY:   Following lower endoscopy (colonoscopy or flexible sigmoidoscopy):  Excessive amounts of blood in the stool  Significant tenderness or worsening of abdominal pains  Swelling of the abdomen that is new, acute  Fever of 100F or higher  For urgent or emergent issues, a gastroenterologist can be reached at any hour by calling [phone].   DIET:  We do recommend a small meal at first, but then you may proceed to your regular diet.  Drink plenty of fluids but you should avoid alcoholic beverages for 24 hours.  ACTIVITY:  You should plan to take it easy for the rest of today and you should NOT DRIVE or use heavy machinery until tomorrow (because of the sedation medicines used during the test).     FOLLOW UP: Our staff will call the number listed on your records the next business day following your procedure to check on you and address any questions or concerns that you may have regarding the information given to you following your procedure. If we do not reach you, we will leave a message.  However, if you are feeling well and you are not experiencing any problems, there is no need to return our call.  We will assume that you have returned to your regular daily activities without incident.  If any biopsies were taken you will be contacted by phone or by letter within the next 1-3 weeks.  Please call us at [phone] if you have not heard about the biopsies in 3 weeks.   Await for biopsy results to determine next repeat Colonoscopy screening Polyps (handout given) Diverticulosis (handout given) Hemorrhoids (handout given)   SIGNATURES/CONFIDENTIALITY: You and/or your care partner have signed paperwork which will be entered into your electronic medical record.  These signatures attest to the fact that that the information above on your After Visit Summary has been reviewed and is understood.  Full responsibility of the confidentiality of this discharge information lies with you and/or your care-partner. 

## 2017-12-18 NOTE — Progress Notes (Signed)
Report to PACU, RN, vss, BBS= Clear.  

## 2017-12-19 ENCOUNTER — Telehealth: Payer: Self-pay | Admitting: *Deleted

## 2017-12-19 NOTE — Telephone Encounter (Signed)
  Follow up Call-  Call back number 12/18/2017  Post procedure Call Back phone  # [phone]  Permission to leave phone message Yes  Some recent data might be hidden     Patient questions:  Do you have a fever, pain , or abdominal swelling? No. Pain Score  0 *  Have you tolerated food without any problems? Yes.    Have you been able to return to your normal activities? Yes.    Do you have any questions about your discharge instructions: Diet   No. Medications  No. Follow up visit  No.  Do you have questions or concerns about your Care? No.  Actions: * If pain score is 4 or above: No action needed, pain <4.

## 2017-12-26 ENCOUNTER — Encounter: Payer: Self-pay | Admitting: Gastroenterology

## 2018-01-02 ENCOUNTER — Other Ambulatory Visit: Payer: Self-pay | Admitting: Family Medicine

## 2018-01-02 DIAGNOSIS — Z1231 Encounter for screening mammogram for malignant neoplasm of breast: Secondary | ICD-10-CM

## 2018-01-04 ENCOUNTER — Ambulatory Visit
Admission: RE | Admit: 2018-01-04 | Discharge: 2018-01-04 | Disposition: A | Discharge status: Home / Self Care | Payer: BLUE CROSS/BLUE SHIELD | Source: Ambulatory Visit | Attending: Family Medicine | Admitting: Family Medicine

## 2018-01-04 DIAGNOSIS — Z1231 Encounter for screening mammogram for malignant neoplasm of breast: Secondary | ICD-10-CM | POA: Diagnosis not present

## 2018-01-07 ENCOUNTER — Other Ambulatory Visit: Payer: Self-pay | Admitting: Family Medicine

## 2018-01-07 DIAGNOSIS — R928 Other abnormal and inconclusive findings on diagnostic imaging of breast: Secondary | ICD-10-CM

## 2018-01-09 ENCOUNTER — Ambulatory Visit
Admission: RE | Admit: 2018-01-09 | Discharge: 2018-01-09 | Disposition: A | Discharge status: Home / Self Care | Payer: BLUE CROSS/BLUE SHIELD | Source: Ambulatory Visit | Attending: Family Medicine | Admitting: Family Medicine

## 2018-01-09 ENCOUNTER — Telehealth: Payer: Self-pay | Admitting: Family Medicine

## 2018-01-09 ENCOUNTER — Other Ambulatory Visit: Payer: Self-pay | Admitting: Family Medicine

## 2018-01-09 DIAGNOSIS — N6001 Solitary cyst of right breast: Secondary | ICD-10-CM | POA: Diagnosis not present

## 2018-01-09 DIAGNOSIS — R928 Other abnormal and inconclusive findings on diagnostic imaging of breast: Secondary | ICD-10-CM

## 2018-01-09 NOTE — Telephone Encounter (Signed)
Copied from [place] [id number]. Topic: Quick Communication - See Telephone Encounter >> Jan 09, 2018 12:30 PM Bea Graff, NT wrote: CRM for notification. See Telephone encounter for: 01/09/18. Pt calling back to get her mammogram results. Please call on mobile phone.

## 2018-01-10 ENCOUNTER — Encounter: Payer: Self-pay | Admitting: *Deleted

## 2018-01-10 ENCOUNTER — Telehealth: Payer: Self-pay | Admitting: Family Medicine

## 2018-01-10 NOTE — Telephone Encounter (Signed)
See lab note- patient notified

## 2018-01-10 NOTE — Telephone Encounter (Signed)
MyChart message sent to patient advising her to schedule CPE. Any necessary labs/screenings can be completed at that time.

## 2018-01-10 NOTE — Telephone Encounter (Signed)
Copied from [place] [id number]. Topic: Quick Communication - See Telephone Encounter >> Jan 10, 2018 11:51 AM Cheyenne Rojas wrote: CRM for notification. See Telephone encounter for: 01/10/18.  Patient states that she saw on her mychart that she is over due for her hep c. Please advise [phone]

## 2018-01-15 ENCOUNTER — Encounter: Payer: BLUE CROSS/BLUE SHIELD | Admitting: Family Medicine

## 2018-01-22 ENCOUNTER — Ambulatory Visit (INDEPENDENT_AMBULATORY_CARE_PROVIDER_SITE_OTHER): Payer: BLUE CROSS/BLUE SHIELD | Admitting: Family Medicine

## 2018-01-22 ENCOUNTER — Encounter: Payer: Self-pay | Admitting: Family Medicine

## 2018-01-22 VITALS — BP 110/80 | HR 90 | Temp 98.1°F | Ht 63.5 in | Wt 152.5 lb

## 2018-01-22 DIAGNOSIS — Z23 Encounter for immunization: Secondary | ICD-10-CM | POA: Diagnosis not present

## 2018-01-22 DIAGNOSIS — Z0000 Encounter for general adult medical examination without abnormal findings: Secondary | ICD-10-CM | POA: Diagnosis not present

## 2018-01-22 LAB — CBC WITH DIFFERENTIAL/PLATELET
Basophils Absolute: 0 10*3/uL (ref 0.0–0.1)
Basophils Relative: 0.6 % (ref 0.0–3.0)
Eosinophils Absolute: 0.1 10*3/uL (ref 0.0–0.7)
Eosinophils Relative: 1.3 % (ref 0.0–5.0)
HCT: 42 % (ref 36.0–46.0)
Hemoglobin: 14.4 g/dL (ref 12.0–15.0)
Lymphocytes Relative: 14.2 % (ref 12.0–46.0)
Lymphs Abs: 0.9 10*3/uL (ref 0.7–4.0)
MCHC: 34.2 g/dL (ref 30.0–36.0)
MCV: 89.1 fl (ref 78.0–100.0)
Monocytes Absolute: 0.5 10*3/uL (ref 0.1–1.0)
Monocytes Relative: 7.2 % (ref 3.0–12.0)
Neutro Abs: 5.1 10*3/uL (ref 1.4–7.7)
Neutrophils Relative %: 76.7 % (ref 43.0–77.0)
Platelets: 214 10*3/uL (ref 150.0–400.0)
RBC: 4.71 Mil/uL (ref 3.87–5.11)
RDW: 12.5 % (ref 11.5–15.5)
WBC: 6.6 10*3/uL (ref 4.0–10.5)

## 2018-01-22 LAB — LIPID PANEL
Cholesterol: 210 mg/dL — ABNORMAL HIGH (ref 0–200)
HDL: 60.6 mg/dL (ref >39.00)
LDL Cholesterol: 129 mg/dL — ABNORMAL HIGH (ref 0–99)
NonHDL: 149.14
Total CHOL/HDL Ratio: 3
Triglycerides: 101 mg/dL (ref 0.0–149.0)
VLDL: 20.2 mg/dL (ref 0.0–40.0)

## 2018-01-22 LAB — TSH: TSH: 1.5 u[IU]/mL (ref 0.35–4.50)

## 2018-01-22 LAB — BASIC METABOLIC PANEL
BUN: 12 mg/dL (ref 6–23)
CO2: 30 mEq/L (ref 19–32)
Calcium: 10.1 mg/dL (ref 8.4–10.5)
Chloride: 102 mEq/L (ref 96–112)
Creatinine, Ser: 0.64 mg/dL (ref 0.40–1.20)
GFR: 102.2 mL/min (ref >60.00)
Glucose, Bld: 99 mg/dL (ref 70–99)
Potassium: 4.3 mEq/L (ref 3.5–5.1)
Sodium: 140 mEq/L (ref 135–145)

## 2018-01-22 LAB — HEPATIC FUNCTION PANEL
ALT: 15 U/L (ref 0–35)
AST: 16 U/L (ref 0–37)
Albumin: 4.9 g/dL (ref 3.5–5.2)
Alkaline Phosphatase: 80 U/L (ref 39–117)
Bilirubin, Direct: 0.1 mg/dL (ref 0.0–0.3)
Total Bilirubin: 0.6 mg/dL (ref 0.2–1.2)
Total Protein: 7.5 g/dL (ref 6.0–8.3)

## 2018-01-22 NOTE — Progress Notes (Signed)
  Subjective:     Patient ID: Cheyenne Rojas, female   DOB: 04-13-1961, 57 y.o.   MRN: [id number]  HPI Patient seen for physical exam. She and her family just moved back here from [place] last Fall. Her husband works for [employer] . They have a 6 year old son. He is actively involved with travel baseball.  She is due for tetanus booster. No history of shingles vaccine. She has questions regarding MMR status. Frequent international travel.  She takes no medications. She had Pap smear last year. She had recent colonoscopy with apparently adenomatous polyp and recommended three-year follow-up  No past medical history on file. Past Surgical History:  Procedure Laterality Date  . CESAREAN SECTION    . COLONOSCOPY    . POLYPECTOMY      reports that she has never smoked. She has never used smokeless tobacco. She reports that she drinks alcohol. She reports that she does not use drugs. family history includes Colon cancer in her maternal grandmother and paternal grandmother. No Known Allergies   Review of Systems  Constitutional: Negative for activity change, appetite change, fatigue, fever and unexpected weight change.  HENT: Negative for ear pain, hearing loss, sore throat and trouble swallowing.   Eyes: Negative for visual disturbance.  Respiratory: Negative for cough and shortness of breath.   Cardiovascular: Negative for chest pain and palpitations.  Gastrointestinal: Negative for abdominal pain, blood in stool, constipation and diarrhea.  Genitourinary: Negative for dysuria and hematuria.  Musculoskeletal: Negative for arthralgias, back pain and myalgias.  Skin: Negative for rash.  Neurological: Negative for dizziness, syncope and headaches.  Hematological: Negative for adenopathy.  Psychiatric/Behavioral: Negative for confusion and dysphoric mood.       Objective:   Physical Exam  Constitutional: She is oriented to person, place, and time. She appears well-developed and  well-nourished.  HENT:  Head: Normocephalic and atraumatic.  Eyes: Pupils are equal, round, and reactive to light. EOM are normal.  Neck: Normal range of motion. Neck supple. No thyromegaly present.  Cardiovascular: Normal rate, regular rhythm and normal heart sounds.  No murmur heard. Pulmonary/Chest: Breath sounds normal. No respiratory distress. She has no wheezes. She has no rales.  Abdominal: Soft. Bowel sounds are normal. She exhibits no distension and no mass. There is no tenderness. There is no rebound and no guarding.  Musculoskeletal: Normal range of motion. She exhibits no edema.  Lymphadenopathy:    She has no cervical adenopathy.  Neurological: She is alert and oriented to person, place, and time. She displays normal reflexes. No cranial nerve deficit.  Skin: No rash noted.  Psychiatric: She has a normal mood and affect. Her behavior is normal. Judgment and thought content normal.       Assessment:     Physical exam. The following issues were addressed    Plan:     -Will check MMR immunity as she has frequent international travel and has no written documentation of prior MMR vaccines -Tetanus booster given -Obtain screening lab work -Repeat Pap smear within 2 years -Recent mammogram already done -Repeat colonoscopy in 3 years -Recommend consider new shingles vaccine and she will be placed on the waiting list  Eulas Post MD [facility]

## 2018-01-22 NOTE — Patient Instructions (Signed)
We will call you about Shingrix vaccine.

## 2018-01-23 ENCOUNTER — Encounter: Payer: Self-pay | Admitting: Family Medicine

## 2018-01-23 LAB — MEASLES/MUMPS/RUBELLA IMMUNITY
Mumps IgG: 89.2 AU/mL
Rubella: 3.04 index
Rubeola IgG: 113 AU/mL

## 2018-02-01 ENCOUNTER — Ambulatory Visit (INDEPENDENT_AMBULATORY_CARE_PROVIDER_SITE_OTHER): Payer: BLUE CROSS/BLUE SHIELD | Admitting: Family Medicine

## 2018-02-01 DIAGNOSIS — Z23 Encounter for immunization: Secondary | ICD-10-CM | POA: Diagnosis not present

## 2018-04-25 ENCOUNTER — Ambulatory Visit (INDEPENDENT_AMBULATORY_CARE_PROVIDER_SITE_OTHER): Payer: BLUE CROSS/BLUE SHIELD

## 2018-04-25 DIAGNOSIS — Z23 Encounter for immunization: Secondary | ICD-10-CM | POA: Diagnosis not present

## 2018-04-26 DIAGNOSIS — D2271 Melanocytic nevi of right lower limb, including hip: Secondary | ICD-10-CM | POA: Diagnosis not present

## 2018-04-26 DIAGNOSIS — D2372 Other benign neoplasm of skin of left lower limb, including hip: Secondary | ICD-10-CM | POA: Diagnosis not present

## 2018-04-26 DIAGNOSIS — D225 Melanocytic nevi of trunk: Secondary | ICD-10-CM | POA: Diagnosis not present

## 2018-04-26 DIAGNOSIS — D2262 Melanocytic nevi of left upper limb, including shoulder: Secondary | ICD-10-CM | POA: Diagnosis not present

## 2018-05-27 DIAGNOSIS — Z23 Encounter for immunization: Secondary | ICD-10-CM | POA: Diagnosis not present

## 2018-06-19 ENCOUNTER — Ambulatory Visit (INDEPENDENT_AMBULATORY_CARE_PROVIDER_SITE_OTHER): Payer: BLUE CROSS/BLUE SHIELD | Admitting: Podiatry

## 2018-06-19 ENCOUNTER — Ambulatory Visit (INDEPENDENT_AMBULATORY_CARE_PROVIDER_SITE_OTHER): Payer: BLUE CROSS/BLUE SHIELD

## 2018-06-19 ENCOUNTER — Encounter: Payer: Self-pay | Admitting: Podiatry

## 2018-06-19 VITALS — BP 130/94 | HR 91

## 2018-06-19 DIAGNOSIS — M659 Synovitis and tenosynovitis, unspecified: Secondary | ICD-10-CM | POA: Diagnosis not present

## 2018-06-19 DIAGNOSIS — M7672 Peroneal tendinitis, left leg: Secondary | ICD-10-CM | POA: Diagnosis not present

## 2018-06-19 DIAGNOSIS — M7752 Other enthesopathy of left foot: Secondary | ICD-10-CM

## 2018-06-19 DIAGNOSIS — M7750 Other enthesopathy of unspecified foot: Secondary | ICD-10-CM

## 2018-06-19 MED ORDER — MELOXICAM 15 MG PO TABS: 15.0000 mg | ORAL_TABLET | Freq: Every day | ORAL | 1 refills | Status: AC

## 2018-06-27 NOTE — Progress Notes (Signed)
   Subjective:  57 year old female presenting today as a new patient with a chief complaint of left ankle pain that began four months ago. She states she may have injured it while playing tennis. She reports associated swelling. Walking and bearing weight increases the pain. She has been resting and icing the area with no significant relief. Patient is here for further evaluation and treatment.   History reviewed. No pertinent past medical history.   Objective / Physical Exam:  General:  The patient is alert and oriented x3 in no acute distress. Dermatology:  Skin is warm, dry and supple bilateral lower extremities. Negative for open lesions or macerations. Vascular:  Palpable pedal pulses bilaterally. No edema or erythema noted. Capillary refill within normal limits. Neurological:  Epicritic and protective threshold grossly intact bilaterally.  Musculoskeletal Exam:  Pain on palpation to the anterior lateral medial aspects of the patient's left ankle. Mild edema noted. Pain with palpation to the insertion of the peroneal tendon of the left foot. Range of motion within normal limits to all pedal and ankle joints bilateral. Muscle strength 5/5 in all groups bilateral.   Radiographic Exam:  Normal osseous mineralization. Joint spaces preserved. No fracture/dislocation/boney destruction.    Assessment: 1. Insertional peroneal tendinitis  2. Left ankle synovitis left   Plan of Care:  1. Patient was evaluated. 2. injection of 0.5 mL Celestone Soluspan injected into the insertion of the peroneal tendon of the left foot.  3. Prescription for Meloxicam provided to patient.  4. Return to clinic in 4 weeks.   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    Edrick Kins, DPM [facility]  Dr. Edrick Kins, [address]                                        [place], [place] [address]                Office [phone]  Fax [phone]

## 2018-07-22 ENCOUNTER — Encounter: Payer: Self-pay | Admitting: Podiatry

## 2018-07-22 ENCOUNTER — Ambulatory Visit (INDEPENDENT_AMBULATORY_CARE_PROVIDER_SITE_OTHER): Payer: BLUE CROSS/BLUE SHIELD | Admitting: Podiatry

## 2018-07-22 ENCOUNTER — Ambulatory Visit: Payer: BLUE CROSS/BLUE SHIELD | Admitting: Podiatry

## 2018-07-22 DIAGNOSIS — M7672 Peroneal tendinitis, left leg: Secondary | ICD-10-CM

## 2018-07-22 MED ORDER — MELOXICAM 15 MG PO TABS: 15.0000 mg | ORAL_TABLET | Freq: Every day | ORAL | 0 refills | Status: AC

## 2018-07-24 NOTE — Progress Notes (Signed)
   Subjective:  57 year old female presenting today for follow up evaluation of left foot and ankle pain. She states her pain has improved significantly. She reports some mild continued swelling that has improved since her previous visit. She denies any new complaints at this time. Patient is here for further evaluation and treatment.   History reviewed. No pertinent past medical history.   Objective / Physical Exam:  General:  The patient is alert and oriented x3 in no acute distress. Dermatology:  Skin is warm, dry and supple bilateral lower extremities. Negative for open lesions or macerations. Vascular:  Palpable pedal pulses bilaterally. No edema or erythema noted. Capillary refill within normal limits. Neurological:  Epicritic and protective threshold grossly intact bilaterally.  Musculoskeletal Exam:  Pain with palpation to the proximal peroneal tendon of the left foot. Range of motion within normal limits to all pedal and ankle joints bilateral. Muscle strength 5/5 in all groups bilateral.   Assessment: 1. Insertional peroneal tendinitis left - resolved  2. Peroneal tendinitis left - proximal   Plan of Care:  1. Patient was evaluated. 2. injection of 0.5 mL Celestone Soluspan injected into the proximal peroneal tendon sheath of the left foot.  3. Continue taking Meloxicam as needed.  4. Recommended good shoe gear.  5. Return to clinic as needed.   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    Edrick Kins, DPM [facility]  Dr. Edrick Kins, [address]                                        [place], [place] [address]                Office [phone]  Fax [phone]

## 2018-09-09 DIAGNOSIS — J019 Acute sinusitis, unspecified: Secondary | ICD-10-CM | POA: Diagnosis not present

## 2018-12-30 DIAGNOSIS — D225 Melanocytic nevi of trunk: Secondary | ICD-10-CM | POA: Diagnosis not present

## 2018-12-30 DIAGNOSIS — L814 Other melanin hyperpigmentation: Secondary | ICD-10-CM | POA: Diagnosis not present

## 2018-12-30 DIAGNOSIS — D2239 Melanocytic nevi of other parts of face: Secondary | ICD-10-CM | POA: Diagnosis not present

## 2018-12-30 DIAGNOSIS — D2262 Melanocytic nevi of left upper limb, including shoulder: Secondary | ICD-10-CM | POA: Diagnosis not present

## 2018-12-30 DIAGNOSIS — L820 Inflamed seborrheic keratosis: Secondary | ICD-10-CM | POA: Diagnosis not present

## 2019-01-13 DIAGNOSIS — Z03818 Encounter for observation for suspected exposure to other biological agents ruled out: Secondary | ICD-10-CM | POA: Diagnosis not present

## 2019-01-15 ENCOUNTER — Other Ambulatory Visit: Payer: Self-pay | Admitting: Family Medicine

## 2019-01-15 DIAGNOSIS — Z1231 Encounter for screening mammogram for malignant neoplasm of breast: Secondary | ICD-10-CM

## 2019-01-22 ENCOUNTER — Other Ambulatory Visit: Payer: Self-pay

## 2019-01-22 ENCOUNTER — Ambulatory Visit
Admission: RE | Admit: 2019-01-22 | Discharge: 2019-01-22 | Disposition: A | Discharge status: Home / Self Care | Payer: BLUE CROSS/BLUE SHIELD | Source: Ambulatory Visit | Attending: Family Medicine | Admitting: Family Medicine

## 2019-01-22 DIAGNOSIS — Z1231 Encounter for screening mammogram for malignant neoplasm of breast: Secondary | ICD-10-CM | POA: Diagnosis not present

## 2019-02-08 ENCOUNTER — Ambulatory Visit: Payer: BLUE CROSS/BLUE SHIELD

## 2019-02-10 ENCOUNTER — Encounter: Payer: Self-pay | Admitting: Family Medicine

## 2019-02-10 DIAGNOSIS — Z6826 Body mass index (BMI) 26.0-26.9, adult: Secondary | ICD-10-CM | POA: Diagnosis not present

## 2019-02-10 DIAGNOSIS — Z01419 Encounter for gynecological examination (general) (routine) without abnormal findings: Secondary | ICD-10-CM | POA: Diagnosis not present

## 2019-02-10 DIAGNOSIS — Z124 Encounter for screening for malignant neoplasm of cervix: Secondary | ICD-10-CM | POA: Diagnosis not present

## 2019-04-01 ENCOUNTER — Ambulatory Visit: Payer: BLUE CROSS/BLUE SHIELD

## 2019-05-06 ENCOUNTER — Ambulatory Visit (INDEPENDENT_AMBULATORY_CARE_PROVIDER_SITE_OTHER): Payer: BC Managed Care – PPO

## 2019-05-06 ENCOUNTER — Ambulatory Visit: Payer: BLUE CROSS/BLUE SHIELD

## 2019-05-06 ENCOUNTER — Other Ambulatory Visit: Payer: Self-pay

## 2019-05-06 DIAGNOSIS — Z23 Encounter for immunization: Secondary | ICD-10-CM

## 2019-07-17 DIAGNOSIS — Z20828 Contact with and (suspected) exposure to other viral communicable diseases: Secondary | ICD-10-CM | POA: Diagnosis not present

## 2019-09-17 DIAGNOSIS — L821 Other seborrheic keratosis: Secondary | ICD-10-CM | POA: Diagnosis not present

## 2019-09-17 DIAGNOSIS — D2261 Melanocytic nevi of right upper limb, including shoulder: Secondary | ICD-10-CM | POA: Diagnosis not present

## 2019-09-17 DIAGNOSIS — D2262 Melanocytic nevi of left upper limb, including shoulder: Secondary | ICD-10-CM | POA: Diagnosis not present

## 2019-09-17 DIAGNOSIS — D2239 Melanocytic nevi of other parts of face: Secondary | ICD-10-CM | POA: Diagnosis not present

## 2019-10-15 ENCOUNTER — Encounter: Payer: Self-pay | Admitting: Podiatry

## 2019-10-15 ENCOUNTER — Other Ambulatory Visit: Payer: Self-pay

## 2019-10-15 ENCOUNTER — Ambulatory Visit (INDEPENDENT_AMBULATORY_CARE_PROVIDER_SITE_OTHER): Payer: BC Managed Care – PPO

## 2019-10-15 ENCOUNTER — Ambulatory Visit (INDEPENDENT_AMBULATORY_CARE_PROVIDER_SITE_OTHER): Payer: BC Managed Care – PPO | Admitting: Podiatry

## 2019-10-15 DIAGNOSIS — M722 Plantar fascial fibromatosis: Secondary | ICD-10-CM | POA: Diagnosis not present

## 2019-10-15 NOTE — Patient Instructions (Signed)
Plantar Fasciitis  Plantar fasciitis is a painful foot condition that affects the heel. It occurs when the band of tissue that connects the toes to the heel bone (plantar fascia) becomes irritated. This can happen as the result of exercising too much or doing other repetitive activities (overuse injury). The pain from plantar fasciitis can range from mild irritation to severe pain that makes it difficult to walk or move. The pain is usually worse in the morning after sleeping, or after sitting or lying down for a while. Pain may also be worse after long periods of walking or standing. What are the causes? This condition may be caused by:  Standing for long periods of time.  Wearing shoes that do not have good arch support.  Doing activities that put stress on joints (high-impact activities), including running, aerobics, and ballet.  Being overweight.  An abnormal way of walking (gait).  Tight muscles in the back of your lower leg (calf).  High arches in your feet.  Starting a new athletic activity. What are the signs or symptoms? The main symptom of this condition is heel pain. Pain may:  Be worse with first steps after a time of rest, especially in the morning after sleeping or after you have been sitting or lying down for a while.  Be worse after long periods of standing still.  Decrease after 30-45 minutes of activity, such as gentle walking. How is this diagnosed? This condition may be diagnosed based on your medical history and your symptoms. Your health care provider may ask questions about your activity level. Your health care provider will do a physical exam to check for:  A tender area on the bottom of your foot.  A high arch in your foot.  Pain when you move your foot.  Difficulty moving your foot. You may have imaging tests to confirm the diagnosis, such as:  X-rays.  Ultrasound.  MRI. How is this treated? Treatment for plantar fasciitis depends on how  severe your condition is. Treatment may include:  Rest, ice, applying pressure (compression), and raising the affected foot (elevation). This may be called RICE therapy. Your health care provider may recommend RICE therapy along with over-the-counter pain medicines to manage your pain.  Exercises to stretch your calves and your plantar fascia.  A splint that holds your foot in a stretched, upward position while you sleep (night splint).  Physical therapy to relieve symptoms and prevent problems in the future.  Injections of steroid medicine (cortisone) to relieve pain and inflammation.  Stimulating your plantar fascia with electrical impulses (extracorporeal shock wave therapy). This is usually the last treatment option before surgery.  Surgery, if other treatments have not worked after 12 months. Follow these instructions at home:  Managing pain, stiffness, and swelling  If directed, put ice on the painful area: ? Put ice in a plastic bag, or use a frozen bottle of water. ? Place a towel between your skin and the bag or bottle. ? Roll the bottom of your foot over the bag or bottle. ? Do this for 20 minutes, 2-3 times a day.  Wear athletic shoes that have air-sole or gel-sole cushions, or try wearing soft shoe inserts that are designed for plantar fasciitis.  Raise (elevate) your foot above the level of your heart while you are sitting or lying down. Activity  Avoid activities that cause pain. Ask your health care provider what activities are safe for you.  Do physical therapy exercises and stretches as told   by your health care provider.  Try activities and forms of exercise that are easier on your joints (low-impact). Examples include swimming, water aerobics, and biking. General instructions  Take over-the-counter and prescription medicines only as told by your health care provider.  Wear a night splint while sleeping, if told by your health care provider. Loosen the splint  if your toes tingle, become numb, or turn cold and blue.  Maintain a healthy weight, or work with your health care provider to lose weight as needed.  Keep all follow-up visits as told by your health care provider. This is important. Contact a health care provider if you:  Have symptoms that do not go away after caring for yourself at home.  Have pain that gets worse.  Have pain that affects your ability to move or do your daily activities. Summary  Plantar fasciitis is a painful foot condition that affects the heel. It occurs when the band of tissue that connects the toes to the heel bone (plantar fascia) becomes irritated.  The main symptom of this condition is heel pain that may be worse after exercising too much or standing still for a long time.  Treatment varies, but it usually starts with rest, ice, compression, and elevation (RICE therapy) and over-the-counter medicines to manage pain. This information is not intended to replace advice given to you by your health care provider. Make sure you discuss any questions you have with your health care provider. Document Revised: 08/03/2017 Document Reviewed: 06/18/2017 Elsevier Patient Education  2020 Elsevier Inc.  

## 2019-10-17 ENCOUNTER — Ambulatory Visit: Payer: Self-pay

## 2019-10-19 NOTE — Progress Notes (Signed)
   Subjective: 59 y.o. female presenting today with a chief complaint of intermittent throbbing plantar and posterior left heel pain that began 6-8 weeks ago. Being on the foot for a long period of time increases the pain. She has been using a night splint that helps lessen the pain. Patient is here for further evaluation and treatment.   History reviewed. No pertinent past medical history.   Objective: Physical Exam General: The patient is alert and oriented x3 in no acute distress.  Dermatology: Skin is warm, dry and supple bilateral lower extremities. Negative for open lesions or macerations bilateral.   Vascular: Dorsalis Pedis and Posterior Tibial pulses palpable bilateral.  Capillary fill time is immediate to all digits.  Neurological: Epicritic and protective threshold intact bilateral.   Musculoskeletal: Tenderness to palpation to the plantar aspect of the left heel along the plantar fascia. All other joints range of motion within normal limits bilateral. Strength 5/5 in all groups bilateral.   Radiographic exam: Normal osseous mineralization. Joint spaces preserved. No fracture/dislocation/boney destruction. No other soft tissue abnormalities or radiopaque foreign bodies.   Assessment: 1. Plantar fasciitis left foot  Plan of Care:  1. Patient evaluated. Xrays reviewed.   2. Injection of 0.5cc Celestone soluspan injected into the left plantar fascia.  3. Resume taking Meloxicam 15 mg daily.  4. Plantar fascial brace dispensed.  5. Return to clinic in 4 weeks.   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 2 year old son that plays basketball at [employer].    Edrick Kins, DPM [facility]  Dr. Edrick Kins, DPM    [address], [place] [address]                Office [phone]  Fax [phone]

## 2019-11-12 ENCOUNTER — Other Ambulatory Visit: Payer: Self-pay

## 2019-11-12 ENCOUNTER — Ambulatory Visit (INDEPENDENT_AMBULATORY_CARE_PROVIDER_SITE_OTHER): Payer: BC Managed Care – PPO | Admitting: Podiatry

## 2019-11-12 DIAGNOSIS — M722 Plantar fascial fibromatosis: Secondary | ICD-10-CM | POA: Diagnosis not present

## 2019-11-16 NOTE — Progress Notes (Signed)
   Subjective: 59 y.o. female presenting today for follow up evaluation of plantar fasciitis of the left foot. She states she is doing well and improving. She states the Meloxicam and fascial brace has helped alleviate her pain. She has been doing exercises daily for treatment. She denies worsening factors. Patient is here for further evaluation and treatment.   No past medical history on file.   Objective: Physical Exam General: The patient is alert and oriented x3 in no acute distress.  Dermatology: Skin is warm, dry and supple bilateral lower extremities. Negative for open lesions or macerations bilateral.   Vascular: Dorsalis Pedis and Posterior Tibial pulses palpable bilateral.  Capillary fill time is immediate to all digits.  Neurological: Epicritic and protective threshold intact bilateral.   Musculoskeletal: Tenderness to palpation to the plantar aspect of the left heel along the plantar fascia. All other joints range of motion within normal limits bilateral. Strength 5/5 in all groups bilateral.   Assessment: 1. Plantar fasciitis left foot - improved   Plan of Care:  1. Patient evaluated.   2. Injection of 0.5cc Celestone soluspan injected into the left plantar fascia.  3. Continue taking Meloxicam as needed.  4. Continue using plantar fascial brace.  5. Return to clinic as needed.   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 67 year old son that plays basketball at [employer].    Edrick Kins, DPM [facility]  Dr. Edrick Kins, DPM    [address], [place] [address]                Office [phone]  Fax [phone]

## 2019-12-22 ENCOUNTER — Other Ambulatory Visit: Payer: Self-pay | Admitting: Family Medicine

## 2019-12-22 DIAGNOSIS — Z1231 Encounter for screening mammogram for malignant neoplasm of breast: Secondary | ICD-10-CM

## 2020-01-26 ENCOUNTER — Other Ambulatory Visit: Payer: Self-pay

## 2020-01-26 ENCOUNTER — Ambulatory Visit
Admission: RE | Admit: 2020-01-26 | Discharge: 2020-01-26 | Disposition: A | Discharge status: Home / Self Care | Payer: BC Managed Care – PPO | Source: Ambulatory Visit | Attending: Family Medicine | Admitting: Family Medicine

## 2020-01-26 DIAGNOSIS — Z1231 Encounter for screening mammogram for malignant neoplasm of breast: Secondary | ICD-10-CM

## 2020-02-18 DIAGNOSIS — Z6827 Body mass index (BMI) 27.0-27.9, adult: Secondary | ICD-10-CM | POA: Diagnosis not present

## 2020-02-18 DIAGNOSIS — Z01419 Encounter for gynecological examination (general) (routine) without abnormal findings: Secondary | ICD-10-CM | POA: Diagnosis not present

## 2020-04-06 DIAGNOSIS — D225 Melanocytic nevi of trunk: Secondary | ICD-10-CM | POA: Diagnosis not present

## 2020-04-06 DIAGNOSIS — D2262 Melanocytic nevi of left upper limb, including shoulder: Secondary | ICD-10-CM | POA: Diagnosis not present

## 2020-04-06 DIAGNOSIS — D2271 Melanocytic nevi of right lower limb, including hip: Secondary | ICD-10-CM | POA: Diagnosis not present

## 2020-04-06 DIAGNOSIS — D2272 Melanocytic nevi of left lower limb, including hip: Secondary | ICD-10-CM | POA: Diagnosis not present

## 2020-06-19 IMAGING — MG DIGITAL SCREENING BILATERAL MAMMOGRAM WITH TOMO AND CAD
8 series · 8 of 24 positions shown · non-contrast
Comparison: Previous exam(s).

CLINICAL DATA: Screening.

EXAM:
DIGITAL SCREENING BILATERAL MAMMOGRAM WITH TOMO AND CAD

[L MLO synth-2D]
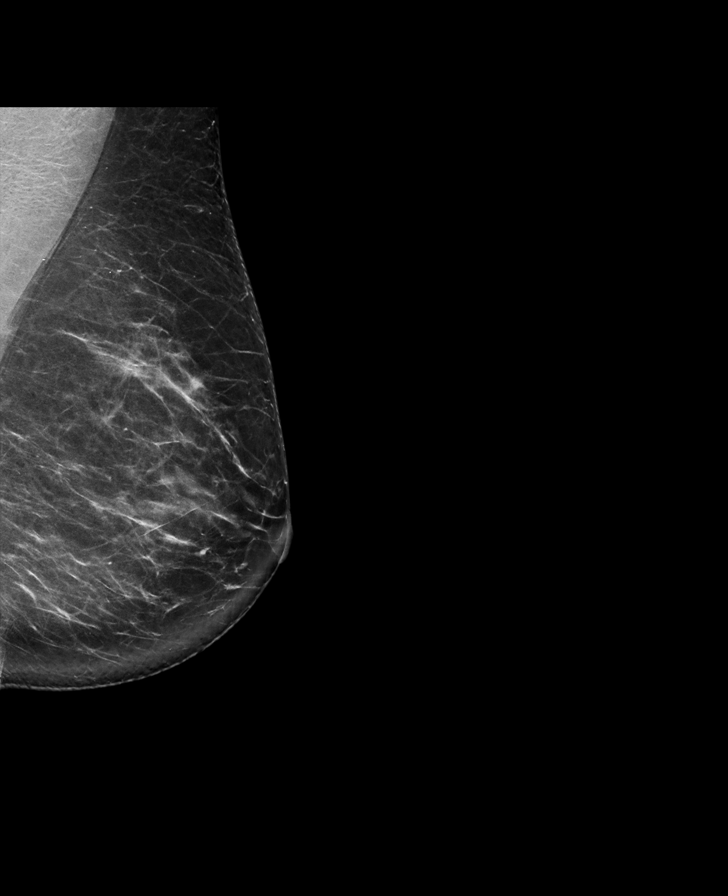

[L CC synth-2D]
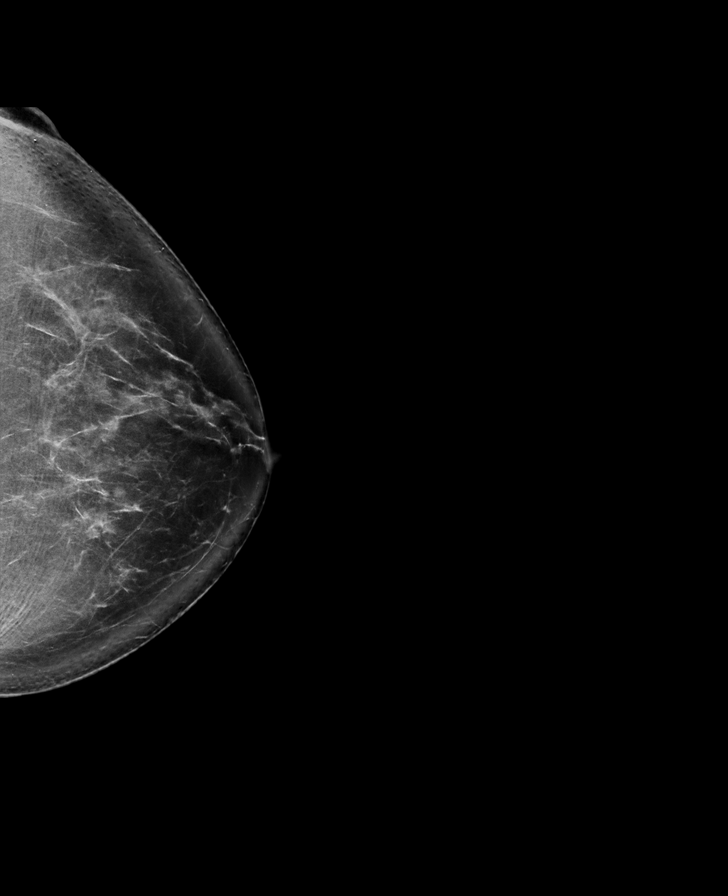

[R CC synth-2D]
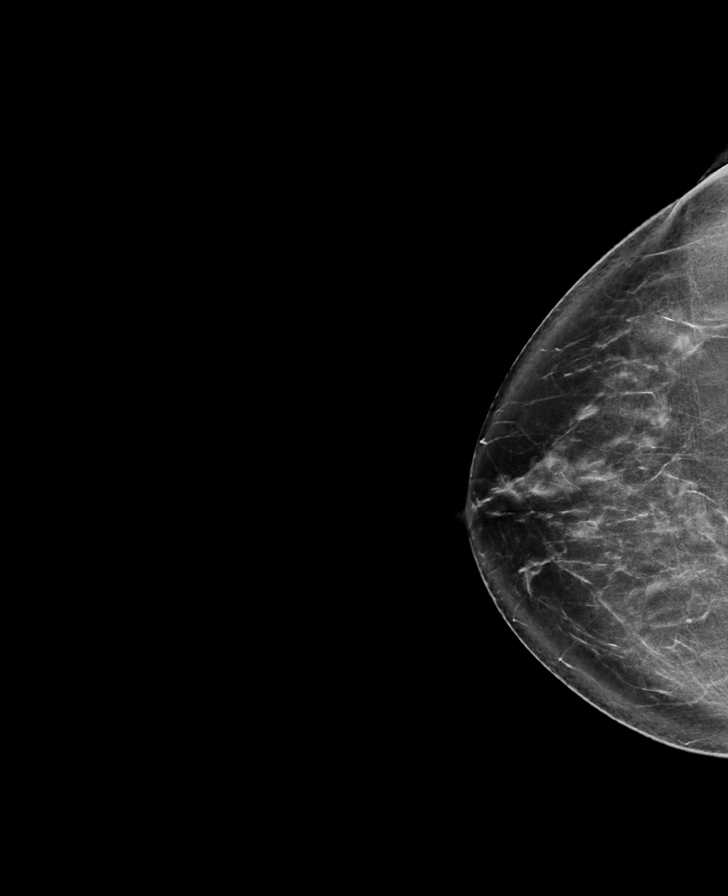

[R MLO synth-2D]
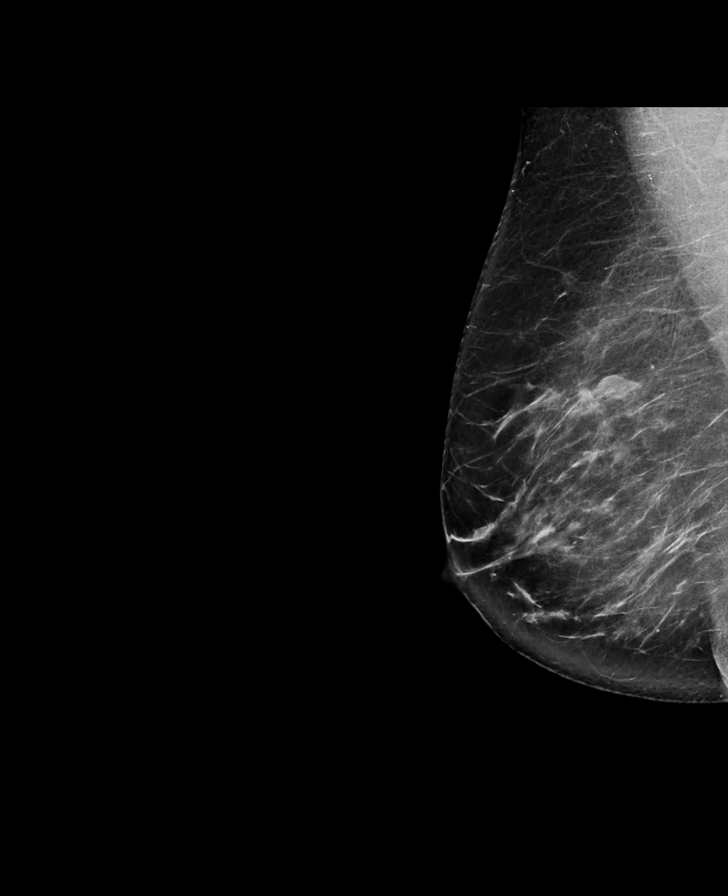

[R MLO tomo · tomo slice 44/87.0]
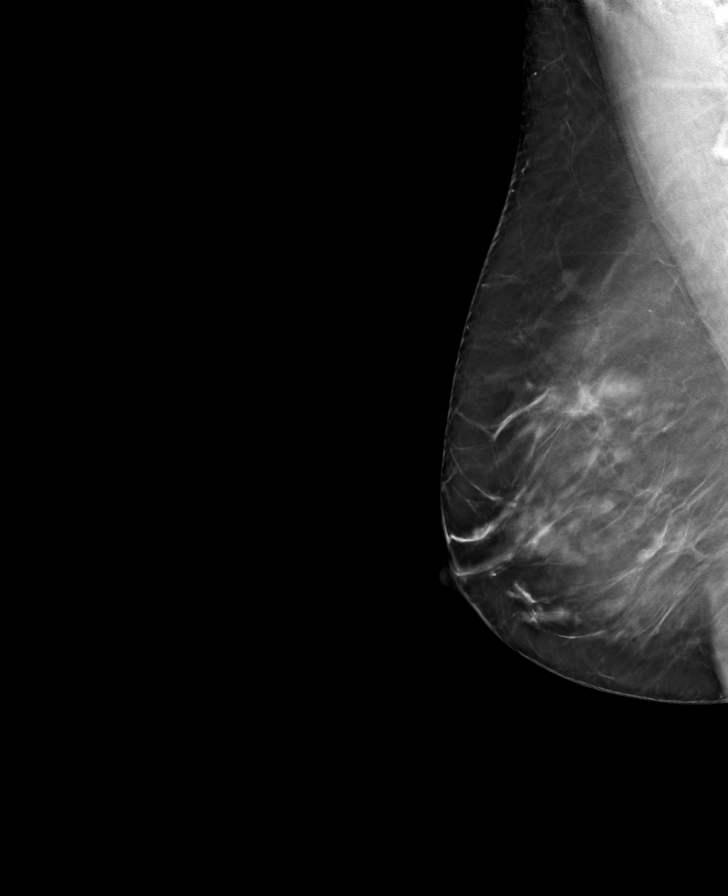

[L MLO tomo · tomo slice 41/82.0]
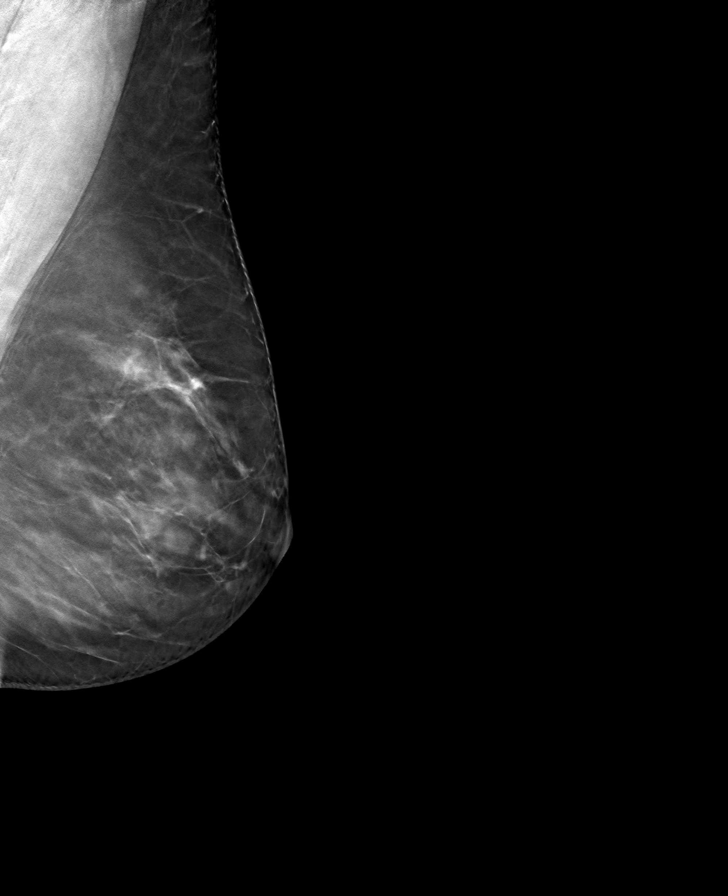

[R CC tomo · tomo slice 43/85.0]
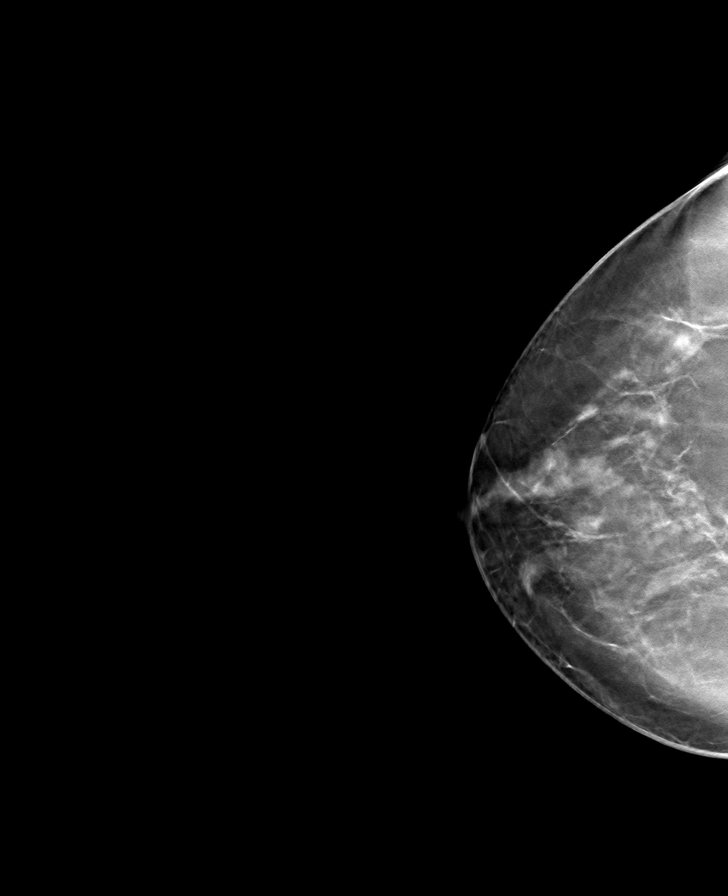

[L CC tomo · tomo slice 43/85.0]
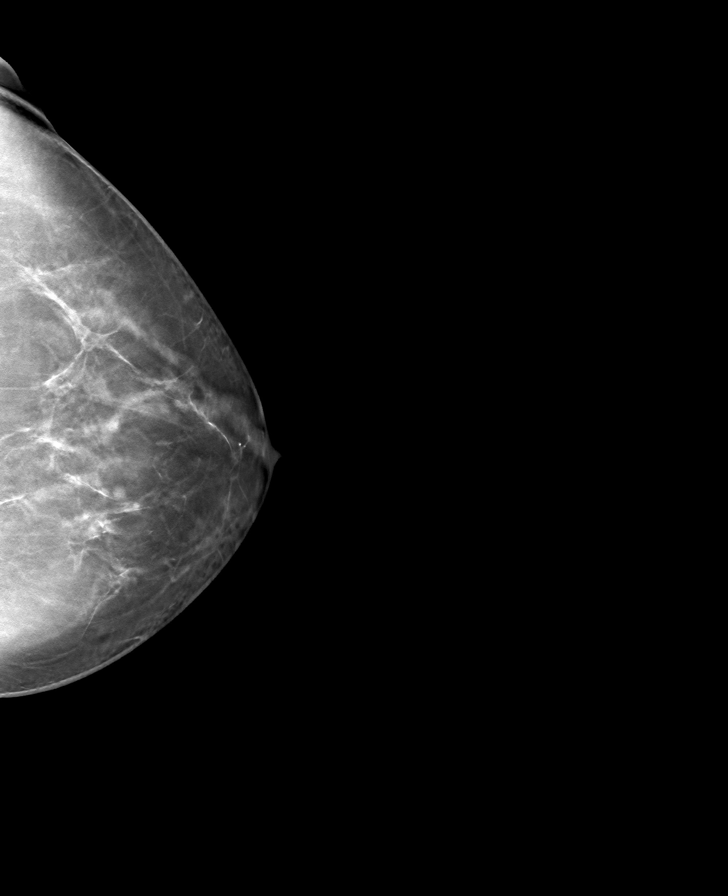

[8 of 24 positions shown; findings below may reference images not displayed]

ACR Breast Density Category b: There are scattered areas of
fibroglandular density.
FINDINGS: There are no findings suspicious for malignancy. Images were
processed with CAD.
IMPRESSION: No mammographic evidence of malignancy. A result letter of this
screening mammogram will be mailed directly to the patient.

RECOMMENDATION:
Screening mammogram in one year. (Code:[id number])

BI-RADS CATEGORY  1: Negative.

## 2020-06-25 DIAGNOSIS — L814 Other melanin hyperpigmentation: Secondary | ICD-10-CM | POA: Diagnosis not present

## 2020-10-11 DIAGNOSIS — D2262 Melanocytic nevi of left upper limb, including shoulder: Secondary | ICD-10-CM | POA: Diagnosis not present

## 2020-10-11 DIAGNOSIS — D2261 Melanocytic nevi of right upper limb, including shoulder: Secondary | ICD-10-CM | POA: Diagnosis not present

## 2020-10-11 DIAGNOSIS — D225 Melanocytic nevi of trunk: Secondary | ICD-10-CM | POA: Diagnosis not present

## 2020-10-11 DIAGNOSIS — D2239 Melanocytic nevi of other parts of face: Secondary | ICD-10-CM | POA: Diagnosis not present

## 2020-12-28 ENCOUNTER — Other Ambulatory Visit: Payer: Self-pay | Admitting: Family Medicine

## 2020-12-28 DIAGNOSIS — Z1231 Encounter for screening mammogram for malignant neoplasm of breast: Secondary | ICD-10-CM

## 2021-01-03 ENCOUNTER — Encounter: Payer: Self-pay | Admitting: Gastroenterology

## 2021-01-24 ENCOUNTER — Encounter: Payer: Self-pay | Admitting: Gastroenterology

## 2021-01-25 ENCOUNTER — Ambulatory Visit
Admission: RE | Admit: 2021-01-25 | Discharge: 2021-01-25 | Disposition: A | Discharge status: Home / Self Care | Payer: BC Managed Care – PPO | Source: Ambulatory Visit | Attending: Family Medicine | Admitting: Family Medicine

## 2021-01-25 ENCOUNTER — Other Ambulatory Visit: Payer: Self-pay

## 2021-01-25 DIAGNOSIS — Z1231 Encounter for screening mammogram for malignant neoplasm of breast: Secondary | ICD-10-CM

## 2021-02-15 ENCOUNTER — Ambulatory Visit: Payer: Self-pay

## 2021-02-21 DIAGNOSIS — Z01419 Encounter for gynecological examination (general) (routine) without abnormal findings: Secondary | ICD-10-CM | POA: Diagnosis not present

## 2021-04-20 ENCOUNTER — Ambulatory Visit (AMBULATORY_SURGERY_CENTER): Payer: Self-pay

## 2021-04-20 VITALS — Ht 63.5 in | Wt 153.0 lb

## 2021-04-20 DIAGNOSIS — Z8371 Family history of colonic polyps: Secondary | ICD-10-CM

## 2021-04-20 DIAGNOSIS — Z86010 Personal history of colonic polyps: Secondary | ICD-10-CM

## 2021-04-20 MED ORDER — SUTAB 1479-225-188 MG PO TABS: 1.0000 | ORAL_TABLET | ORAL | 0 refills | Status: DC

## 2021-04-20 NOTE — Progress Notes (Signed)
Patient is here in-person for PV. Patient denies any allergies to eggs or soy. Patient denies any problems with anesthesia/sedation. Patient denies any oxygen use at home. Patient denies taking any diet/weight loss medications or blood thinners. Patient is aware of our care-partner policy and 0000000 safety protocol.   EMMI education assigned to the patient for the procedure, sent to [place].   Patient is COVID-19 vaccinated.  Sutab Prep Prescription coupon was given to the patient. And sent to pharmacy in notes.

## 2021-04-29 ENCOUNTER — Encounter: Payer: Self-pay | Admitting: Gastroenterology

## 2021-05-04 ENCOUNTER — Encounter: Payer: Self-pay | Admitting: Gastroenterology

## 2021-05-04 ENCOUNTER — Ambulatory Visit (AMBULATORY_SURGERY_CENTER): Payer: BC Managed Care – PPO | Admitting: Gastroenterology

## 2021-05-04 ENCOUNTER — Other Ambulatory Visit: Payer: Self-pay

## 2021-05-04 VITALS — BP 115/52 | HR 68 | Temp 97.3°F | Resp 16 | Ht 63.5 in | Wt 153.0 lb

## 2021-05-04 DIAGNOSIS — Z86010 Personal history of colonic polyps: Secondary | ICD-10-CM

## 2021-05-04 DIAGNOSIS — K635 Polyp of colon: Secondary | ICD-10-CM

## 2021-05-04 DIAGNOSIS — Z8371 Family history of colonic polyps: Secondary | ICD-10-CM

## 2021-05-04 DIAGNOSIS — Z1211 Encounter for screening for malignant neoplasm of colon: Secondary | ICD-10-CM | POA: Diagnosis not present

## 2021-05-04 DIAGNOSIS — D123 Benign neoplasm of transverse colon: Secondary | ICD-10-CM

## 2021-05-04 MED ORDER — SODIUM CHLORIDE 0.9 % IV SOLN: 500.0000 mL | Freq: Once | INTRAVENOUS | Status: DC

## 2021-05-04 NOTE — Progress Notes (Signed)
Called to room to assist during endoscopic procedure.  Patient ID and intended procedure confirmed with present staff. Received instructions for my participation in the procedure from the performing physician.  

## 2021-05-04 NOTE — Progress Notes (Signed)
[facility] History and Physical   Primary Care Physician:  Eulas Post, MD   Reason for Procedure:  History of adenomatous colon polyps  Plan:    Surveillance colonoscopy with possible interventions as needed     HPI: Cheyenne Rojas is a very pleasant 60 y.o. female here for surveillance colonoscopy. Denies any nausea, vomiting, abdominal pain, melena or bright red blood per rectum  The risks and benefits as well as alternatives of endoscopic procedure(s) have been discussed and reviewed. All questions answered. The patient agrees to proceed.    History reviewed. No pertinent past medical history.  Past Surgical History:  Procedure Laterality Date   CESAREAN SECTION     COLONOSCOPY     POLYPECTOMY      Prior to Admission medications   Medication Sig Start Date End Date Taking? Authorizing Provider  acetaminophen (TYLENOL) 325 MG tablet Take 650 mg by mouth every 6 (six) hours as needed.   Yes [provider]    Current Outpatient Medications  Medication Sig Dispense Refill   acetaminophen (TYLENOL) 325 MG tablet Take 650 mg by mouth every 6 (six) hours as needed.     Current Facility-Administered Medications  Medication Dose Route Frequency Provider Last Rate Last Admin   0.9 %  sodium chloride infusion  500 mL Intravenous Once Mauri Pole, MD        Allergies as of 05/04/2021   (No Known Allergies)    Family History  Problem Relation Age of Onset   Rectal cancer Maternal Grandmother    Colon cancer Maternal Grandmother        died in eary 53's   Colon cancer Paternal Grandmother        dx early 45'd and diead 83   Esophageal cancer Neg Hx    Stomach cancer Neg Hx    Colon polyps Neg Hx    Breast cancer Neg Hx     Social History   Socioeconomic History   Marital status: Married    Spouse name: Not on file   Number of children: Not on file   Years of education: Not on file   Highest education level: Not on  file  Occupational History   Not on file  Tobacco Use   Smoking status: Never   Smokeless tobacco: Never  Vaping Use   Vaping Use: Never used  Substance and Sexual Activity   Alcohol use: Yes    Comment: occasional use   Drug use: No   Sexual activity: Yes    Comment: husband- vasectomy  Other Topics Concern   Not on file  Social History Narrative   Not on file   Social Determinants of Health   Financial Resource Strain: Not on file  Food Insecurity: Not on file  Transportation Needs: Not on file  Physical Activity: Not on file  Stress: Not on file  Social Connections: Not on file  Intimate Partner Violence: Not on file    Review of Systems:  All other review of systems negative except as mentioned in the HPI.  Physical Exam: Vital signs in last 24 hours: BP (!) 142/72   Pulse 88   Temp (!) 97.3 F (36.3 C) (Temporal)   Ht 5' 3.5" (1.613 m)   Wt 153 lb (69.4 kg)   SpO2 100%   BMI 26.68 kg/m     General:   Alert, NAD Lungs:  Clear .   Heart:  Regular rate and rhythm Abdomen:  Soft, nontender  and nondistended. Neuro/Psych:  Alert and cooperative. Normal mood and affect. A and O x 3  Reviewed labs, radiology imaging, old records and pertinent past GI work up  Patient is appropriate for planned procedure(s) and anesthesia in an ambulatory setting   K. Denzil Magnuson , MD [phone]

## 2021-05-04 NOTE — Progress Notes (Signed)
VS-CW  Pt's states no medical or surgical changes since previsit or office visit.  

## 2021-05-04 NOTE — Patient Instructions (Signed)
Please read handouts provided. Continue present medications. Await pathology results. Repeat colonoscopy for screening in 5 years.   YOU HAD AN ENDOSCOPIC PROCEDURE TODAY AT [facility]:   Refer to the procedure report that was given to you for any specific questions about what was found during the examination.  If the procedure report does not answer your questions, please call your gastroenterologist to clarify.  If you requested that your care partner not be given the details of your procedure findings, then the procedure report has been included in a sealed envelope for you to review at your convenience later.  YOU SHOULD EXPECT: Some feelings of bloating in the abdomen. Passage of more gas than usual.  Walking can help get rid of the air that was put into your GI tract during the procedure and reduce the bloating. If you had a lower endoscopy (such as a colonoscopy or flexible sigmoidoscopy) you may notice spotting of blood in your stool or on the toilet paper. If you underwent a bowel prep for your procedure, you may not have a normal bowel movement for a few days.  Please Note:  You might notice some irritation and congestion in your nose or some drainage.  This is from the oxygen used during your procedure.  There is no need for concern and it should clear up in a day or so.  SYMPTOMS TO REPORT IMMEDIATELY:  Following lower endoscopy (colonoscopy or flexible sigmoidoscopy):  Excessive amounts of blood in the stool  Significant tenderness or worsening of abdominal pains  Swelling of the abdomen that is new, acute  Fever of 100F or higher    For urgent or emergent issues, a gastroenterologist can be reached at any hour by calling [phone]. Do not use MyChart messaging for urgent concerns.    DIET:  We do recommend a small meal at first, but then you may proceed to your regular diet.  Drink plenty of fluids but you should avoid alcoholic beverages for 24  hours.  ACTIVITY:  You should plan to take it easy for the rest of today and you should NOT DRIVE or use heavy machinery until tomorrow (because of the sedation medicines used during the test).    FOLLOW UP: Our staff will call the number listed on your records 48-72 hours following your procedure to check on you and address any questions or concerns that you may have regarding the information given to you following your procedure. If we do not reach you, we will leave a message.  We will attempt to reach you two times.  During this call, we will ask if you have developed any symptoms of COVID 19. If you develop any symptoms (ie: fever, flu-like symptoms, shortness of breath, cough etc.) before then, please call [phone].  If you test positive for Covid 19 in the 2 weeks post procedure, please call and report this information to [place].    If any biopsies were taken you will be contacted by phone or by letter within the next 1-3 weeks.  Please call us at [phone] if you have not heard about the biopsies in 3 weeks.    SIGNATURES/CONFIDENTIALITY: You and/or your care partner have signed paperwork which will be entered into your electronic medical record.  These signatures attest to the fact that that the information above on your After Visit Summary has been reviewed and is understood.  Full responsibility of the confidentiality of this discharge information lies with you and/or your  care-partner.  

## 2021-05-04 NOTE — Progress Notes (Signed)
To PACU, VSS. Report to [email or web address] 

## 2021-05-04 NOTE — Op Note (Signed)
[facility] Patient Name: Cheyenne Rojas Procedure Date: 05/04/2021 10:03 AM MRN: [id number] Endoscopist: Mauri Pole , MD Age: 60 Referring MD:  Date of Birth: [birth year] Gender: Female Account #: [id number] Procedure:                Colonoscopy Indications:              High risk colon cancer surveillance: Personal                            history of colonic polyps, High risk colon cancer                            surveillance: Personal history of adenoma (10 mm or                            greater in size) Medicines:                Monitored Anesthesia Care Procedure:                Pre-Anesthesia Assessment:                           - Prior to the procedure, a History and Physical                            was performed, and patient medications and                            allergies were reviewed. The patient's tolerance of                            previous anesthesia was also reviewed. The risks                            and benefits of the procedure and the sedation                            options and risks were discussed with the patient.                            All questions were answered, and informed consent                            was obtained. Prior Anticoagulants: The patient has                            taken no previous anticoagulant or antiplatelet                            agents. ASA Grade Assessment: II - A patient with                            mild systemic disease. After reviewing the risks  and benefits, the patient was deemed in                            satisfactory condition to undergo the procedure.                           After obtaining informed consent, the colonoscope                            was passed under direct vision. Throughout the                            procedure, the patient's blood pressure, pulse, and                            oxygen saturations were monitored  continuously. The                            PCF-HQ190L Colonoscope was introduced through the                            anus and advanced to the the cecum, identified by                            appendiceal orifice and ileocecal valve. The                            colonoscopy was performed without difficulty. The                            patient tolerated the procedure well. The quality                            of the bowel preparation was excellent. The                            ileocecal valve, appendiceal orifice, and rectum                            were photographed. Scope In: 10:15:06 AM Scope Out: 10:29:08 AM Scope Withdrawal Time: 0 hours 8 minutes 6 seconds  Total Procedure Duration: 0 hours 14 minutes 2 seconds  Findings:                 The perianal and digital rectal examinations were                            normal.                           A 5 mm polyp was found in the transverse colon. The                            polyp was sessile. The polyp was removed with a  cold snare. Resection and retrieval were complete.                           Scattered small and large-mouthed diverticula were                            found in the sigmoid colon, ascending colon and                            cecum.                           Non-bleeding external and internal hemorrhoids were                            found during retroflexion. The hemorrhoids were                            large. Complications:            No immediate complications. Estimated Blood Loss:     Estimated blood loss was minimal. Impression:               - One 5 mm polyp in the transverse colon, removed                            with a cold snare. Resected and retrieved.                           - Diverticulosis in the sigmoid colon, in the                            ascending colon and in the cecum.                           - Non-bleeding external and internal  hemorrhoids. Recommendation:           - Patient has a contact number available for                            emergencies. The signs and symptoms of potential                            delayed complications were discussed with the                            patient. Return to normal activities tomorrow.                            Written discharge instructions were provided to the                            patient.                           - Resume previous diet.                           -  Continue present medications.                           - Await pathology results.                           - Repeat colonoscopy in 5 years for surveillance                            based on pathology results. Mauri Pole, MD 05/04/2021 10:34:39 AM This report has been signed electronically.

## 2021-05-06 ENCOUNTER — Telehealth: Payer: Self-pay

## 2021-05-06 ENCOUNTER — Telehealth: Payer: Self-pay | Admitting: *Deleted

## 2021-05-06 NOTE — Telephone Encounter (Signed)
  Follow up Call-  Call back number 05/04/2021  Post procedure Call Back phone  # [phone]  Permission to leave phone message Yes  Some recent data might be hidden   LMOM to call back with any questions or concerns.  Also, call back if patient has developed fever, respiratory issues or been dx with COVID or had any family members or close contacts diagnosed since her procedure.

## 2021-05-06 NOTE — Telephone Encounter (Signed)
Left message on follow up call. 

## 2021-05-10 ENCOUNTER — Ambulatory Visit (INDEPENDENT_AMBULATORY_CARE_PROVIDER_SITE_OTHER): Payer: BC Managed Care – PPO

## 2021-05-10 ENCOUNTER — Other Ambulatory Visit: Payer: Self-pay

## 2021-05-10 DIAGNOSIS — Z23 Encounter for immunization: Secondary | ICD-10-CM

## 2021-05-17 ENCOUNTER — Encounter: Payer: Self-pay | Admitting: Gastroenterology

## 2021-06-14 DIAGNOSIS — D2262 Melanocytic nevi of left upper limb, including shoulder: Secondary | ICD-10-CM | POA: Diagnosis not present

## 2021-06-14 DIAGNOSIS — D225 Melanocytic nevi of trunk: Secondary | ICD-10-CM | POA: Diagnosis not present

## 2021-06-14 DIAGNOSIS — D2272 Melanocytic nevi of left lower limb, including hip: Secondary | ICD-10-CM | POA: Diagnosis not present

## 2021-06-14 DIAGNOSIS — D2261 Melanocytic nevi of right upper limb, including shoulder: Secondary | ICD-10-CM | POA: Diagnosis not present

## 2021-12-12 ENCOUNTER — Other Ambulatory Visit: Payer: Self-pay | Admitting: Family Medicine

## 2021-12-12 DIAGNOSIS — Z1231 Encounter for screening mammogram for malignant neoplasm of breast: Secondary | ICD-10-CM

## 2021-12-26 ENCOUNTER — Ambulatory Visit (INDEPENDENT_AMBULATORY_CARE_PROVIDER_SITE_OTHER): Payer: BC Managed Care – PPO | Admitting: Family Medicine

## 2021-12-26 ENCOUNTER — Encounter: Payer: Self-pay | Admitting: Family Medicine

## 2021-12-26 VITALS — BP 120/76 | HR 72 | Temp 98.2°F | Ht 63.5 in | Wt 152.3 lb

## 2021-12-26 DIAGNOSIS — R0981 Nasal congestion: Secondary | ICD-10-CM | POA: Diagnosis not present

## 2021-12-26 DIAGNOSIS — R519 Headache, unspecified: Secondary | ICD-10-CM

## 2021-12-26 DIAGNOSIS — R059 Cough, unspecified: Secondary | ICD-10-CM

## 2021-12-26 LAB — POC COVID19 BINAXNOW: SARS Coronavirus 2 Ag: NEGATIVE

## 2021-12-26 MED ORDER — BENZONATATE 100 MG PO CAPS: 100.0000 mg | ORAL_CAPSULE | Freq: Three times a day (TID) | ORAL | 0 refills | Status: AC | PRN

## 2021-12-26 NOTE — Patient Instructions (Signed)
Consider OTC Flonase or Nasacort AQ daily for nasal congestion symptoms.   ?

## 2021-12-26 NOTE — Progress Notes (Signed)
? ?  Subjective:  ? ? Patient ID: Cheyenne Rojas, female    DOB: July 29, 1961, 61 y.o.   MRN: [id number] ? ?HPI ? ?Roshelle seen with onset a few days ago of some nasal congestion and little bit of cough.  No sick contacts.  She took some over-the-counter Claritin without much improvement.  Cough especially bothersome at night.  Has had occasional headaches and sinus pressure.  No purulent secretions.  No bloody secretions.  No fevers or chills.  No body aches.  No significant sore throat.  No nausea or vomiting.  She is a non-smoker and generally very healthy. ? ?History reviewed. No pertinent past medical history. ?Past Surgical History:  ?Procedure Laterality Date  ? CESAREAN SECTION    ? COLONOSCOPY    ? POLYPECTOMY    ? ? reports that she has never smoked. She has never used smokeless tobacco. She reports current alcohol use. She reports that she does not use drugs. ?family history includes Colon cancer in her maternal grandmother and paternal grandmother; Rectal cancer in her maternal grandmother. ?No Known Allergies ? ? ? ?Review of Systems  ?Constitutional:  Negative for chills and fever.  ?HENT:  Positive for congestion.   ?Respiratory:  Positive for cough. Negative for shortness of breath and wheezing.   ?Neurological:  Positive for headaches.  ? ?   ?Objective:  ? Physical Exam ?Vitals reviewed.  ?Constitutional:   ?   Appearance: She is well-developed. She is not ill-appearing or toxic-appearing.  ?HENT:  ?   Mouth/Throat:  ?   Mouth: Mucous membranes are moist.  ?   Pharynx: Oropharynx is clear.  ?Cardiovascular:  ?   Rate and Rhythm: Normal rate and regular rhythm.  ?Pulmonary:  ?   Effort: Pulmonary effort is normal.  ?   Breath sounds: Normal breath sounds.  ?Musculoskeletal:  ?   Cervical back: Neck supple.  ?Lymphadenopathy:  ?   Cervical: No cervical adenopathy.  ?Neurological:  ?   Mental Status: She is alert.  ? ? ? ? ? ?   ?Assessment & Plan:  ? ?Upper respiratory congestion with cough.   Differential is allergic versus viral.  COVID test negative.  Doubt bacterial sinusitis based on history ? ?-Consider adding Flonase nasal to her Claritin ?-Tessalon Perles 100 mg every 8 hours as needed for cough ?-Follow-up promptly for any fever or persistent symptoms.  She has scheduled follow-up next week for physical and reassess at that time ? ?Eulas Post MD ?[facility] ? ? ?

## 2022-01-02 ENCOUNTER — Encounter: Payer: Self-pay | Admitting: Family Medicine

## 2022-01-02 ENCOUNTER — Ambulatory Visit (INDEPENDENT_AMBULATORY_CARE_PROVIDER_SITE_OTHER): Payer: BC Managed Care – PPO | Admitting: Family Medicine

## 2022-01-02 VITALS — BP 114/70 | HR 72 | Temp 99.0°F | Ht 64.37 in | Wt 152.2 lb

## 2022-01-02 DIAGNOSIS — Z0000 Encounter for general adult medical examination without abnormal findings: Secondary | ICD-10-CM

## 2022-01-02 LAB — LIPID PANEL
Cholesterol: 194 mg/dL (ref 0–200)
HDL: 65.6 mg/dL (ref >39.00)
LDL Cholesterol: 110 mg/dL — ABNORMAL HIGH (ref 0–99)
NonHDL: 128.1
Total CHOL/HDL Ratio: 3
Triglycerides: 91 mg/dL (ref 0.0–149.0)
VLDL: 18.2 mg/dL (ref 0.0–40.0)

## 2022-01-02 LAB — BASIC METABOLIC PANEL
BUN: 12 mg/dL (ref 6–23)
CO2: 31 mEq/L (ref 19–32)
Calcium: 9.6 mg/dL (ref 8.4–10.5)
Chloride: 104 mEq/L (ref 96–112)
Creatinine, Ser: 0.68 mg/dL (ref 0.40–1.20)
GFR: 95.3 mL/min (ref >60.00)
Glucose, Bld: 100 mg/dL — ABNORMAL HIGH (ref 70–99)
Potassium: 4.4 mEq/L (ref 3.5–5.1)
Sodium: 142 mEq/L (ref 135–145)

## 2022-01-02 LAB — HEPATIC FUNCTION PANEL
ALT: 20 U/L (ref 0–35)
AST: 20 U/L (ref 0–37)
Albumin: 4.7 g/dL (ref 3.5–5.2)
Alkaline Phosphatase: 76 U/L (ref 39–117)
Bilirubin, Direct: 0.1 mg/dL (ref 0.0–0.3)
Total Bilirubin: 0.6 mg/dL (ref 0.2–1.2)
Total Protein: 7.4 g/dL (ref 6.0–8.3)

## 2022-01-02 LAB — CBC WITH DIFFERENTIAL/PLATELET
Basophils Absolute: 0 10*3/uL (ref 0.0–0.1)
Basophils Relative: 0.8 % (ref 0.0–3.0)
Eosinophils Absolute: 0.2 10*3/uL (ref 0.0–0.7)
Eosinophils Relative: 3.4 % (ref 0.0–5.0)
HCT: 42.8 % (ref 36.0–46.0)
Hemoglobin: 14.6 g/dL (ref 12.0–15.0)
Lymphocytes Relative: 22 % (ref 12.0–46.0)
Lymphs Abs: 1.1 10*3/uL (ref 0.7–4.0)
MCHC: 34.1 g/dL (ref 30.0–36.0)
MCV: 91 fl (ref 78.0–100.0)
Monocytes Absolute: 0.5 10*3/uL (ref 0.1–1.0)
Monocytes Relative: 11.1 % (ref 3.0–12.0)
Neutro Abs: 3 10*3/uL (ref 1.4–7.7)
Neutrophils Relative %: 62.7 % (ref 43.0–77.0)
Platelets: 228 10*3/uL (ref 150.0–400.0)
RBC: 4.7 Mil/uL (ref 3.87–5.11)
RDW: 12.5 % (ref 11.5–15.5)
WBC: 4.8 10*3/uL (ref 4.0–10.5)

## 2022-01-02 LAB — TSH: TSH: 0.88 u[IU]/mL (ref 0.35–5.50)

## 2022-01-02 NOTE — Progress Notes (Signed)
? ?Established Patient Office Visit ? ?Subjective   ?Patient ID: Cheyenne Rojas, female    DOB: 03-30-1961  Age: 61 y.o. MRN: [id number] ? ?Chief Complaint  ?Patient presents with  ? Annual Exam  ? ? ?HPI ? ?History reviewed. No pertinent past medical history. ?Past Surgical History:  ?Procedure Laterality Date  ? CESAREAN SECTION    ? COLONOSCOPY    ? POLYPECTOMY    ? ?Social History  ? ?Socioeconomic History  ? Marital status: Married  ?  Spouse name: Not on file  ? Number of children: Not on file  ? Years of education: Not on file  ? Highest education level: Not on file  ?Occupational History  ? Not on file  ?Tobacco Use  ? Smoking status: Never  ? Smokeless tobacco: Never  ?Vaping Use  ? Vaping Use: Never used  ?Substance and Sexual Activity  ? Alcohol use: Yes  ?  Comment: occasional use  ? Drug use: No  ? Sexual activity: Yes  ?  Comment: husband- vasectomy  ?Other Topics Concern  ? Not on file  ?Social History Narrative  ? Not on file  ? ?Social Determinants of Health  ? ?Financial Resource Strain: Not on file  ?Food Insecurity: Not on file  ?Transportation Needs: Not on file  ?Physical Activity: Not on file  ?Stress: Not on file  ?Social Connections: Not on file  ?Intimate Partner Violence: Not on file  ? ?Family History  ?Problem Relation Age of Onset  ? Cancer Father 60  ?     non-Hodgkins Lymphoma  ? Hypertension Brother   ? Cancer Maternal Grandmother   ?     colon  ? Rectal cancer Maternal Grandmother   ? Colon cancer Maternal Grandmother   ?     died in eary 30's  ? Cancer Paternal Grandmother   ?     colon  ? Colon cancer Paternal Grandmother   ?     dx early 70'd and diead 70  ? Esophageal cancer Neg Hx   ? Stomach cancer Neg Hx   ? Colon polyps Neg Hx   ? Breast cancer Neg Hx   ? ?  ?Here for physical exam.  She sees gynecologist regularly and also sees dermatologist yearly for skin cancer screening.  She has not had a physical here in a few years prior to the pandemic.  She is very  health-conscious.  She walks over 5 miles per day.  Weight has been very stable over the past several years.  Respiratory symptoms improved from last week.  We suspected either recent virus and/or allergy. ? ?She does not take any medications.  No significant chronic medical problems.  Health maintenance reviewed: ? ?-Shingrix vaccine complete ?-Previous hepatitis C antibody negative ?-Tetanus due 2029 ?-Colonoscopy due 2027 ?-She gets yearly mammograms and next is due in May ?-She gets yearly flu vaccine ?-Pap smear up-to-date and through GYN ? ?Family history reviewed-she has a brother with hypertension.  Father has non-Hodgkin's lymphoma diagnosed age 24.  Mother has no significant health problems.  She has maternal and paternal grandmothers with colon cancer ? ?Social history-she is married.  Son who will attend [employer] next year and plans to play baseball.  Anie has never smoked. ? ?Review of Systems  ?Constitutional:  Negative for fever and weight loss.  ?Respiratory:  Negative for shortness of breath and wheezing.   ?Cardiovascular:  Negative for leg swelling.  ?Gastrointestinal:  Negative for abdominal pain,  nausea and vomiting.  ?Genitourinary:  Negative for dysuria.  ?Skin:  Negative for rash.  ?Neurological:  Negative for dizziness and weakness.  ?Psychiatric/Behavioral:  Negative for depression.   ? ?  ?Objective:  ?  ? ?BP 114/70 (BP Location: Left Arm, Patient Position: Sitting, Cuff Size: Normal)   Pulse 72   Temp 99 ?F (37.2 ?C) (Oral)   Ht 5' 4.37" (1.635 m)   Wt 152 lb 3.2 oz (69 kg)   SpO2 99%   BMI 25.83 kg/m?  ?Wt Readings from Last 3 Encounters:  ?01/02/22 152 lb 3.2 oz (69 kg)  ?12/26/21 152 lb 4.8 oz (69.1 kg)  ?05/04/21 153 lb (69.4 kg)  ? ?  ? ?Physical Exam ?Constitutional:   ?   Appearance: She is well-developed.  ?HENT:  ?   Head: Normocephalic and atraumatic.  ?Eyes:  ?   Pupils: Pupils are equal, round, and reactive to light.  ?Neck:  ?   Thyroid: No thyromegaly.   ?Cardiovascular:  ?   Rate and Rhythm: Normal rate and regular rhythm.  ?   Heart sounds: Normal heart sounds. No murmur heard. ?Pulmonary:  ?   Effort: No respiratory distress.  ?   Breath sounds: Normal breath sounds. No wheezing or rales.  ?Abdominal:  ?   General: Bowel sounds are normal. There is no distension.  ?   Palpations: Abdomen is soft. There is no mass.  ?   Tenderness: There is no abdominal tenderness. There is no guarding or rebound.  ?Musculoskeletal:  ?   Cervical back: Normal range of motion and neck supple.  ?   Right lower leg: No edema.  ?   Left lower leg: No edema.  ?Lymphadenopathy:  ?   Cervical: No cervical adenopathy.  ?Skin: ?   Findings: No rash.  ?Neurological:  ?   Mental Status: She is alert and oriented to person, place, and time.  ?   Cranial Nerves: No cranial nerve deficit.  ?Psychiatric:     ?   Behavior: Behavior normal.     ?   Thought Content: Thought content normal.     ?   Judgment: Judgment normal.  ? ? ? ?No results found for any visits on 01/02/22. ? ?Last lipids ?Lab Results  ?Component Value Date  ? CHOL 210 (H) 01/22/2018  ? HDL 60.60 01/22/2018  ? LDLCALC 129 (H) 01/22/2018  ? TRIG 101.0 01/22/2018  ? CHOLHDL 3 01/22/2018  ? ?  ? ?The ASCVD Risk score (Arnett DK, et al., 2019) failed to calculate for the following reasons: ?  Cannot find a previous HDL lab ?  Cannot find a previous total cholesterol lab ? ?  ?Assessment & Plan:  ? ?Problem List Items Addressed This Visit   ?None ?Visit Diagnoses   ? ? Physical exam    -  Primary  ? Relevant Orders  ? Basic metabolic panel  ? Lipid panel  ? CBC with Differential/Platelet  ? TSH  ? Hepatic function panel  ? ?  ?-She plans to continue regular yearly GYN follow-up for Pap smears and mammograms ? ?-Obtain screening labs as above ? ?-Continue regular weightbearing exercise ? ?-Continue annual flu vaccine ? ?-COVID vaccines up-to-date ? ?No follow-ups on file.  ? ? ?Carolann Littler, MD ? ?

## 2022-01-26 ENCOUNTER — Ambulatory Visit: Payer: BC Managed Care – PPO

## 2022-01-26 ENCOUNTER — Ambulatory Visit
Admission: RE | Admit: 2022-01-26 | Discharge: 2022-01-26 | Disposition: A | Discharge status: Home / Self Care | Payer: BC Managed Care – PPO | Source: Ambulatory Visit | Attending: Family Medicine | Admitting: Family Medicine

## 2022-01-26 DIAGNOSIS — Z1231 Encounter for screening mammogram for malignant neoplasm of breast: Secondary | ICD-10-CM

## 2022-02-15 DIAGNOSIS — D225 Melanocytic nevi of trunk: Secondary | ICD-10-CM | POA: Diagnosis not present

## 2022-02-15 DIAGNOSIS — L821 Other seborrheic keratosis: Secondary | ICD-10-CM | POA: Diagnosis not present

## 2022-02-15 DIAGNOSIS — D2261 Melanocytic nevi of right upper limb, including shoulder: Secondary | ICD-10-CM | POA: Diagnosis not present

## 2022-02-15 DIAGNOSIS — D2262 Melanocytic nevi of left upper limb, including shoulder: Secondary | ICD-10-CM | POA: Diagnosis not present

## 2022-03-16 DIAGNOSIS — Z124 Encounter for screening for malignant neoplasm of cervix: Secondary | ICD-10-CM | POA: Diagnosis not present

## 2022-03-16 DIAGNOSIS — Z6826 Body mass index (BMI) 26.0-26.9, adult: Secondary | ICD-10-CM | POA: Diagnosis not present

## 2022-03-16 DIAGNOSIS — Z01419 Encounter for gynecological examination (general) (routine) without abnormal findings: Secondary | ICD-10-CM | POA: Diagnosis not present

## 2022-06-01 ENCOUNTER — Ambulatory Visit (INDEPENDENT_AMBULATORY_CARE_PROVIDER_SITE_OTHER): Payer: BC Managed Care – PPO | Admitting: *Deleted

## 2022-06-01 DIAGNOSIS — Z23 Encounter for immunization: Secondary | ICD-10-CM | POA: Diagnosis not present

## 2022-06-02 ENCOUNTER — Ambulatory Visit: Payer: BC Managed Care – PPO

## 2022-06-26 ENCOUNTER — Telehealth: Payer: Self-pay | Admitting: Family Medicine

## 2022-06-26 NOTE — Telephone Encounter (Signed)
Patient dropped off paperwork needing to be completed by Dr.Burchette. Form was completed and paperwork was placed in folder for completion.    Patient made aware of 5-7 business day turn around. Patient verbalized understanding.   Please advise

## 2022-10-18 DIAGNOSIS — D2261 Melanocytic nevi of right upper limb, including shoulder: Secondary | ICD-10-CM | POA: Diagnosis not present

## 2022-10-18 DIAGNOSIS — D225 Melanocytic nevi of trunk: Secondary | ICD-10-CM | POA: Diagnosis not present

## 2022-10-18 DIAGNOSIS — D2271 Melanocytic nevi of right lower limb, including hip: Secondary | ICD-10-CM | POA: Diagnosis not present

## 2022-10-18 DIAGNOSIS — D2262 Melanocytic nevi of left upper limb, including shoulder: Secondary | ICD-10-CM | POA: Diagnosis not present

## 2022-12-11 ENCOUNTER — Ambulatory Visit (INDEPENDENT_AMBULATORY_CARE_PROVIDER_SITE_OTHER): Payer: BC Managed Care – PPO

## 2022-12-11 ENCOUNTER — Ambulatory Visit (INDEPENDENT_AMBULATORY_CARE_PROVIDER_SITE_OTHER): Payer: BC Managed Care – PPO | Admitting: Podiatry

## 2022-12-11 DIAGNOSIS — M79672 Pain in left foot: Secondary | ICD-10-CM

## 2022-12-11 DIAGNOSIS — M21622 Bunionette of left foot: Secondary | ICD-10-CM

## 2022-12-11 NOTE — Progress Notes (Signed)
   Chief Complaint  Patient presents with   Bunions    Patient came in today for left foot tailor bunion pain rubbing in shoes and burning, red and swelling,  rate of pain 8 out of 10 while walking, X-rays done today      HPI: 62 y.o. female presenting today as a reestablish new patient for evaluation of left foot pain.  Patient states that she has developed pain and tenderness associated to the bunion on the outside of her left foot.  It is very symptomatic with narrow her shoes.  There is been ongoing now for several years.  Denies a history of injury.  Gradual onset  No past medical history on file.  Past Surgical History:  Procedure Laterality Date   CESAREAN SECTION     COLONOSCOPY     POLYPECTOMY      No Known Allergies   Physical Exam: General: The patient is alert and oriented x3 in no acute distress.  Dermatology: Skin is warm, dry and supple bilateral lower extremities. Negative for open lesions or macerations.  Vascular: Palpable pedal pulses bilaterally. Capillary refill within normal limits.  Negative for any significant edema or erythema  Neurological: Light touch and protective threshold grossly intact  Musculoskeletal Exam: Tailor's bunion deformity noted with loading of the forefoot.  Moderate hallux valgus deformity also noted which is asymptomatic clinically.  There is some tenderness with palpation around the fifth metatarsal head  Radiographic Exam LT foot 12/11/2022:  Normal osseous mineralization. Joint spaces preserved.  Increased intermetatarsal space between the fourth and fifth metatarsals with lateral deviation of the fifth metatarsal head consistent with a tailor's bunionette deformity  Assessment: 1.  Symptomatic tailor's bunionette left   Plan of Care:  1. Patient evaluated. X-Rays reviewed.  2.  Discussed different treatment options both conservative and surgical.  For now the patient would like to pursue conservative treatment.  Recommend wide  fitting shoes that do not irritate or constrict the toebox area or irritate the bunion site. 3.  Return to clinic as needed  Husband is Teacher, English as a foreign language for [employer]. They're son is going to [employer] to play Baseball     Felecia Shelling, DPM [facility]  Dr. Felecia Shelling, DPM    [address] [place], [place] [address]                Office [phone]  Fax [phone]

## 2022-12-18 ENCOUNTER — Other Ambulatory Visit: Payer: Self-pay | Admitting: Family Medicine

## 2022-12-18 DIAGNOSIS — Z1231 Encounter for screening mammogram for malignant neoplasm of breast: Secondary | ICD-10-CM

## 2022-12-19 ENCOUNTER — Ambulatory Visit (INDEPENDENT_AMBULATORY_CARE_PROVIDER_SITE_OTHER): Payer: BC Managed Care – PPO | Admitting: Sports Medicine

## 2022-12-19 VITALS — BP 118/80 | HR 76 | Ht 64.0 in | Wt 142.0 lb

## 2022-12-19 DIAGNOSIS — S46812A Strain of other muscles, fascia and tendons at shoulder and upper arm level, left arm, initial encounter: Secondary | ICD-10-CM

## 2022-12-19 MED ORDER — MELOXICAM 15 MG PO TABS: 15.0000 mg | ORAL_TABLET | Freq: Every day | ORAL | 0 refills | Status: AC

## 2022-12-19 NOTE — Progress Notes (Signed)
    Aleen Sells D.[facility] [address] [place] [address] Phone: [phone]   Assessment and Plan:     1. Strain of left trapezius muscle, initial encounter  -Acute, uncomplicated, initial sports medicine visit - Suspect strain of left trapezius muscle, likely occurring during coughing activities - No red flag symptoms on physical exam or HPI, so no imaging at today's visit - Start meloxicam 15 mg daily x2 weeks.  If still having pain after 2 weeks, complete 3rd-week of meloxicam. May use remaining meloxicam as needed once daily for pain control.  Do not to use additional NSAIDs while taking meloxicam.  May use Tylenol [phone] mg 2 to 3 times a day for breakthrough pain. - Start HEP for trapezius  Pertinent previous records reviewed include none   Follow Up: As needed if no improvement in 3 to 4 weeks.  Could consider physical therapy versus OMT versus trigger point injections based on presentation   Subjective:   I, Cheyenne Rojas, am serving as a Neurosurgeon for Doctor Richardean Sale  Chief Complaint: neck pain   HPI:   12/19/22 Patient is a 62 year old female complaining of neck pain. Patient states that she might of hurt it playing golf 2 weeks ago, salonpase patches , decreased Rom . Pain radiates down to her arm , intermittent pain, motrin for the pain, no numbness or tingling, she is able to do ADL,   Relevant Historical Information: None pertinent  Additional pertinent review of systems negative.   Current Outpatient Medications:    acetaminophen (TYLENOL) 325 MG tablet, Take 650 mg by mouth every 6 (six) hours as needed., Disp: , Rfl:    benzonatate (TESSALON) 100 MG capsule, Take 1 capsule (100 mg total) by mouth 3 (three) times daily as needed for cough., Disp: 30 capsule, Rfl: 0   meloxicam (MOBIC) 15 MG tablet, Take 1 tablet (15 mg total) by mouth daily., Disp: 30 tablet, Rfl: 0   Objective:     Vitals:   12/19/22  1028  BP: 118/80  Pulse: 76  SpO2: 98%  Weight: 142 lb (64.4 kg)  Height:  (1.626 m)      Body mass index is 24.37 kg/m.    Physical Exam:    Neck Exam: Cervical Spine- Posture normal Skin- normal, intact  Neuro:  Strength-  Right Left   Deltoid (C5) 5/5 5/5  Bicep/Brachioradialis (C5/6) 5/5  5/5  Wrist Extension (C6) 5/5 5/5  Tricep (C7) 5/5 5/5  Wrist Flexion (C7) 5/5 5/5  Grip (C8) 5/5 5/5  Finger Abduction (T1) 5/5 5/5   Sensation: intact to light touch in upper extremities bilaterally  Spurling's:  negative bilaterally Neck ROM: Full active ROM, though mild discomfort in left paraspinal with flexion and extension TTP: left trap, left cervical paraspinal NTTP: cervical spinous processes, right cervical paraspinal, thoracic paraspinal, right trapezius    Electronically signed by:  Aleen Sells D.[facility] 11:49 AM 12/19/22

## 2022-12-19 NOTE — Patient Instructions (Addendum)
Good to see you  Trap HEP - Start meloxicam 15 mg daily x2 weeks.  If still having pain after 2 weeks, complete 3rd-week of meloxicam. May use remaining meloxicam as needed once daily for pain control.  Do not to use additional NSAIDs while taking meloxicam.  May use Tylenol [phone] mg 2 to 3 times a day for breakthrough pain. As needed follow up

## 2023-01-01 ENCOUNTER — Other Ambulatory Visit: Payer: Self-pay | Admitting: Podiatry

## 2023-01-01 DIAGNOSIS — M79672 Pain in left foot: Secondary | ICD-10-CM

## 2023-01-01 DIAGNOSIS — M21622 Bunionette of left foot: Secondary | ICD-10-CM

## 2023-01-03 DIAGNOSIS — H02889 Meibomian gland dysfunction of unspecified eye, unspecified eyelid: Secondary | ICD-10-CM | POA: Diagnosis not present

## 2023-01-03 DIAGNOSIS — H43811 Vitreous degeneration, right eye: Secondary | ICD-10-CM | POA: Diagnosis not present

## 2023-01-03 DIAGNOSIS — H00013 Hordeolum externum right eye, unspecified eyelid: Secondary | ICD-10-CM | POA: Diagnosis not present

## 2023-01-30 ENCOUNTER — Ambulatory Visit
Admission: RE | Admit: 2023-01-30 | Discharge: 2023-01-30 | Disposition: A | Discharge status: Home / Self Care | Payer: BC Managed Care – PPO | Source: Ambulatory Visit | Attending: Family Medicine | Admitting: Family Medicine

## 2023-01-30 DIAGNOSIS — Z1231 Encounter for screening mammogram for malignant neoplasm of breast: Secondary | ICD-10-CM

## 2023-05-15 DIAGNOSIS — Z6824 Body mass index (BMI) 24.0-24.9, adult: Secondary | ICD-10-CM | POA: Diagnosis not present

## 2023-05-15 DIAGNOSIS — Z01419 Encounter for gynecological examination (general) (routine) without abnormal findings: Secondary | ICD-10-CM | POA: Diagnosis not present

## 2023-05-18 ENCOUNTER — Ambulatory Visit (INDEPENDENT_AMBULATORY_CARE_PROVIDER_SITE_OTHER): Payer: BC Managed Care – PPO

## 2023-05-18 DIAGNOSIS — Z23 Encounter for immunization: Secondary | ICD-10-CM

## 2023-07-03 DIAGNOSIS — D2261 Melanocytic nevi of right upper limb, including shoulder: Secondary | ICD-10-CM | POA: Diagnosis not present

## 2023-07-03 DIAGNOSIS — D2239 Melanocytic nevi of other parts of face: Secondary | ICD-10-CM | POA: Diagnosis not present

## 2023-07-03 DIAGNOSIS — L821 Other seborrheic keratosis: Secondary | ICD-10-CM | POA: Diagnosis not present

## 2023-07-03 DIAGNOSIS — D2262 Melanocytic nevi of left upper limb, including shoulder: Secondary | ICD-10-CM | POA: Diagnosis not present

## 2023-12-21 ENCOUNTER — Other Ambulatory Visit: Payer: Self-pay | Admitting: Family Medicine

## 2023-12-21 DIAGNOSIS — Z1231 Encounter for screening mammogram for malignant neoplasm of breast: Secondary | ICD-10-CM

## 2024-01-29 ENCOUNTER — Encounter

## 2024-01-29 DIAGNOSIS — Z1231 Encounter for screening mammogram for malignant neoplasm of breast: Secondary | ICD-10-CM

## 2024-01-31 ENCOUNTER — Ambulatory Visit
Admission: RE | Admit: 2024-01-31 | Discharge: 2024-01-31 | Disposition: A | Discharge status: Home / Self Care | Source: Ambulatory Visit

## 2024-01-31 DIAGNOSIS — Z1231 Encounter for screening mammogram for malignant neoplasm of breast: Secondary | ICD-10-CM

## 2024-03-03 DIAGNOSIS — D225 Melanocytic nevi of trunk: Secondary | ICD-10-CM | POA: Diagnosis not present

## 2024-03-03 DIAGNOSIS — D2262 Melanocytic nevi of left upper limb, including shoulder: Secondary | ICD-10-CM | POA: Diagnosis not present

## 2024-03-03 DIAGNOSIS — D2261 Melanocytic nevi of right upper limb, including shoulder: Secondary | ICD-10-CM | POA: Diagnosis not present

## 2024-03-03 DIAGNOSIS — D2239 Melanocytic nevi of other parts of face: Secondary | ICD-10-CM | POA: Diagnosis not present

## 2024-05-26 DIAGNOSIS — Z01419 Encounter for gynecological examination (general) (routine) without abnormal findings: Secondary | ICD-10-CM | POA: Diagnosis not present
